# Patient Record
Sex: Female | Born: 1970 | Race: White | Hispanic: No | Marital: Married | Smoking: Never smoker
Health system: Southern US, Community
[De-identification: ages and names within clinical notes are randomized; demographics above are authoritative.]

## PROBLEM LIST (undated history)

## (undated) DIAGNOSIS — D259 Leiomyoma of uterus, unspecified: Secondary | ICD-10-CM

## (undated) DIAGNOSIS — N393 Stress incontinence (female) (male): Secondary | ICD-10-CM

## (undated) DIAGNOSIS — K219 Gastro-esophageal reflux disease without esophagitis: Secondary | ICD-10-CM

## (undated) DIAGNOSIS — N951 Menopausal and female climacteric states: Secondary | ICD-10-CM

## (undated) DIAGNOSIS — I10 Essential (primary) hypertension: Secondary | ICD-10-CM

## (undated) DIAGNOSIS — E782 Mixed hyperlipidemia: Secondary | ICD-10-CM

## (undated) DIAGNOSIS — A630 Anogenital (venereal) warts: Secondary | ICD-10-CM

## (undated) DIAGNOSIS — Z803 Family history of malignant neoplasm of breast: Secondary | ICD-10-CM

## (undated) DIAGNOSIS — E118 Type 2 diabetes mellitus with unspecified complications: Secondary | ICD-10-CM

## (undated) DIAGNOSIS — Z9289 Personal history of other medical treatment: Secondary | ICD-10-CM

## (undated) DIAGNOSIS — E119 Type 2 diabetes mellitus without complications: Secondary | ICD-10-CM

## (undated) DIAGNOSIS — E785 Hyperlipidemia, unspecified: Secondary | ICD-10-CM

## (undated) DIAGNOSIS — R7989 Other specified abnormal findings of blood chemistry: Secondary | ICD-10-CM

## (undated) HISTORY — DX: Family history of malignant neoplasm of breast: Z80.3

## (undated) HISTORY — DX: Anogenital (venereal) warts: A63.0

## (undated) HISTORY — DX: Essential (primary) hypertension: I10

## (undated) HISTORY — DX: Type 2 diabetes mellitus without complications: E11.9

## (undated) HISTORY — DX: Stress incontinence (female) (male): N39.3

## (undated) HISTORY — DX: Personal history of other medical treatment: Z92.89

## (undated) HISTORY — DX: Hyperlipidemia, unspecified: E78.5

## (undated) HISTORY — DX: Leiomyoma of uterus, unspecified: D25.9

## (undated) HISTORY — PX: EYE SURGERY: SHX253

---

## 1996-03-26 DIAGNOSIS — I10 Essential (primary) hypertension: Secondary | ICD-10-CM

## 1996-03-26 DIAGNOSIS — E119 Type 2 diabetes mellitus without complications: Secondary | ICD-10-CM

## 1996-03-26 HISTORY — DX: Type 2 diabetes mellitus without complications: E11.9

## 1996-03-26 HISTORY — DX: Essential (primary) hypertension: I10

## 1998-03-26 DIAGNOSIS — N393 Stress incontinence (female) (male): Secondary | ICD-10-CM

## 1998-03-26 HISTORY — DX: Stress incontinence (female) (male): N39.3

## 2001-12-11 ENCOUNTER — Other Ambulatory Visit: Admission: RE | Admit: 2001-12-11 | Discharge: 2001-12-11 | Payer: Self-pay | Admitting: Gynecology

## 2004-09-10 ENCOUNTER — Emergency Department: Payer: Self-pay | Admitting: Emergency Medicine

## 2007-09-17 ENCOUNTER — Ambulatory Visit: Payer: Self-pay | Admitting: Obstetrics & Gynecology

## 2010-06-01 ENCOUNTER — Ambulatory Visit: Payer: Self-pay | Admitting: Obstetrics & Gynecology

## 2010-06-08 HISTORY — PX: INTRAUTERINE DEVICE (IUD) INSERTION: SHX5877

## 2010-07-29 ENCOUNTER — Ambulatory Visit: Payer: Self-pay | Admitting: Internal Medicine

## 2011-02-05 ENCOUNTER — Ambulatory Visit: Payer: Self-pay | Admitting: Family Medicine

## 2011-02-24 ENCOUNTER — Ambulatory Visit: Payer: Self-pay | Admitting: Family Medicine

## 2011-03-27 ENCOUNTER — Ambulatory Visit: Payer: Self-pay | Admitting: Family Medicine

## 2011-10-11 ENCOUNTER — Emergency Department: Payer: Self-pay | Admitting: Emergency Medicine

## 2011-10-11 LAB — URINALYSIS, COMPLETE
Bacteria: NONE SEEN
Bilirubin,UR: NEGATIVE
Glucose,UR: NEGATIVE mg/dL (ref 0–75)
Ketone: NEGATIVE
Leukocyte Esterase: NEGATIVE
Ph: 6 (ref 4.5–8.0)
RBC,UR: <1 /HPF (ref 0–5)
Specific Gravity: 1.003 (ref 1.003–1.030)
Squamous Epithelial: <1
WBC UR: NONE SEEN /HPF (ref 0–5)

## 2011-10-11 LAB — BASIC METABOLIC PANEL
Anion Gap: 8 (ref 7–16)
BUN: 12 mg/dL (ref 7–18)
Calcium, Total: 9.5 mg/dL (ref 8.5–10.1)
EGFR (African American): >60
EGFR (Non-African Amer.): >60
Glucose: 194 mg/dL — ABNORMAL HIGH (ref 65–99)
Osmolality: 286 (ref 275–301)
Potassium: 4.8 mmol/L (ref 3.5–5.1)
Sodium: 141 mmol/L (ref 136–145)

## 2011-10-11 LAB — CK TOTAL AND CKMB (NOT AT ARMC): CK, Total: 56 U/L (ref 21–215)

## 2011-10-11 LAB — CBC
HCT: 36.2 % (ref 35.0–47.0)
MCHC: 32.6 g/dL (ref 32.0–36.0)
MCV: 90 fL (ref 80–100)
RBC: 4.05 10*6/uL (ref 3.80–5.20)
RDW: 12.9 % (ref 11.5–14.5)
WBC: 5.1 10*3/uL (ref 3.6–11.0)

## 2011-10-11 LAB — TROPONIN I: Troponin-I: <0.02 ng/mL

## 2012-04-28 IMAGING — CR DG LUMBAR SPINE COMPLETE 4+V
1 series · 5 of 5 positions shown · non-contrast
Comparison: none

REASON FOR EXAM: Fell 1 week ago; c/o low back pain/"tailbone'  pain
COMMENTS:   LMP: One week ago

PROCEDURE:     MDR - M[REDACTED] SPINE WITH OBLIQUES  - July 29, 2010  [DATE]
RESULT:     Comparison: None.

[Series 1: view not recorded · 0.17mm/px · 5 of 5 slices shown]
[im 1/5]
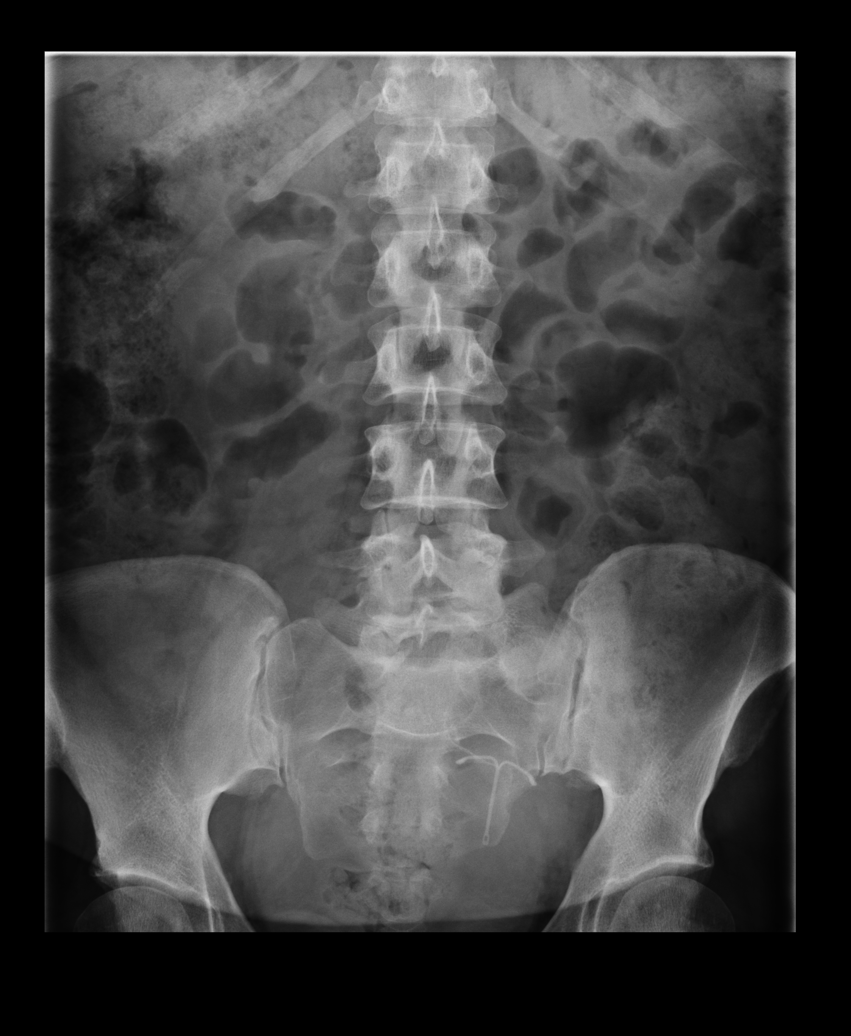
[im 2/5]
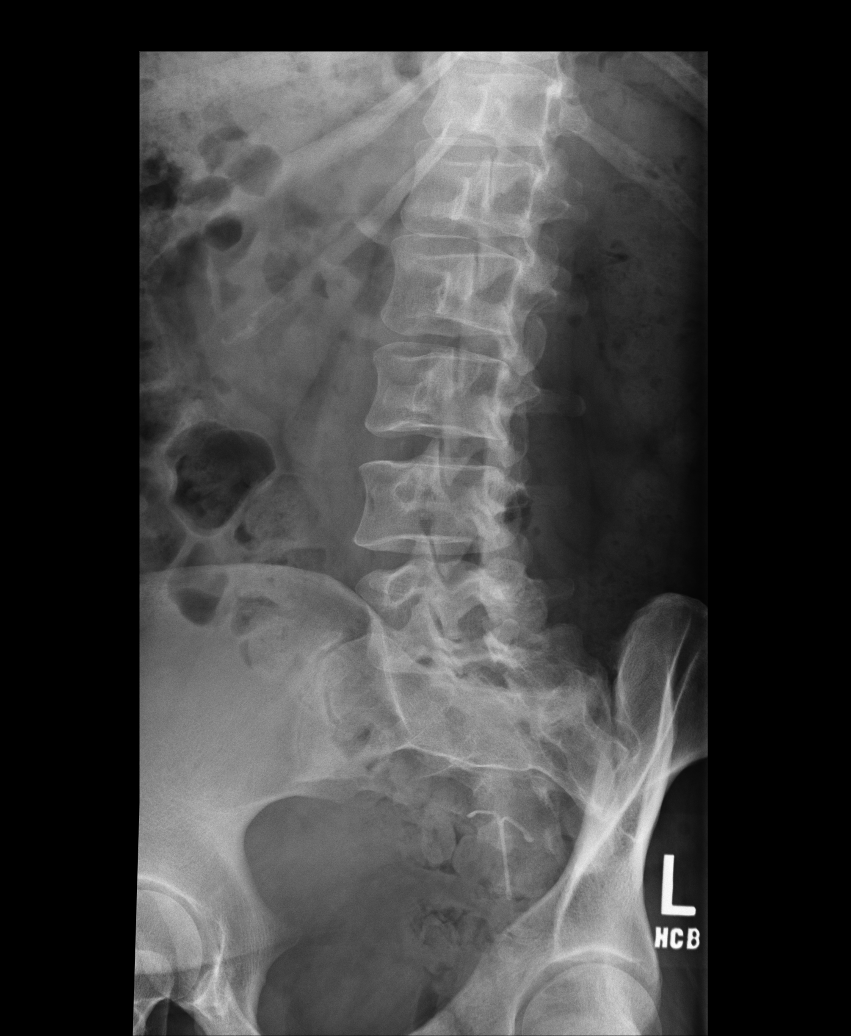
[im 3/5]
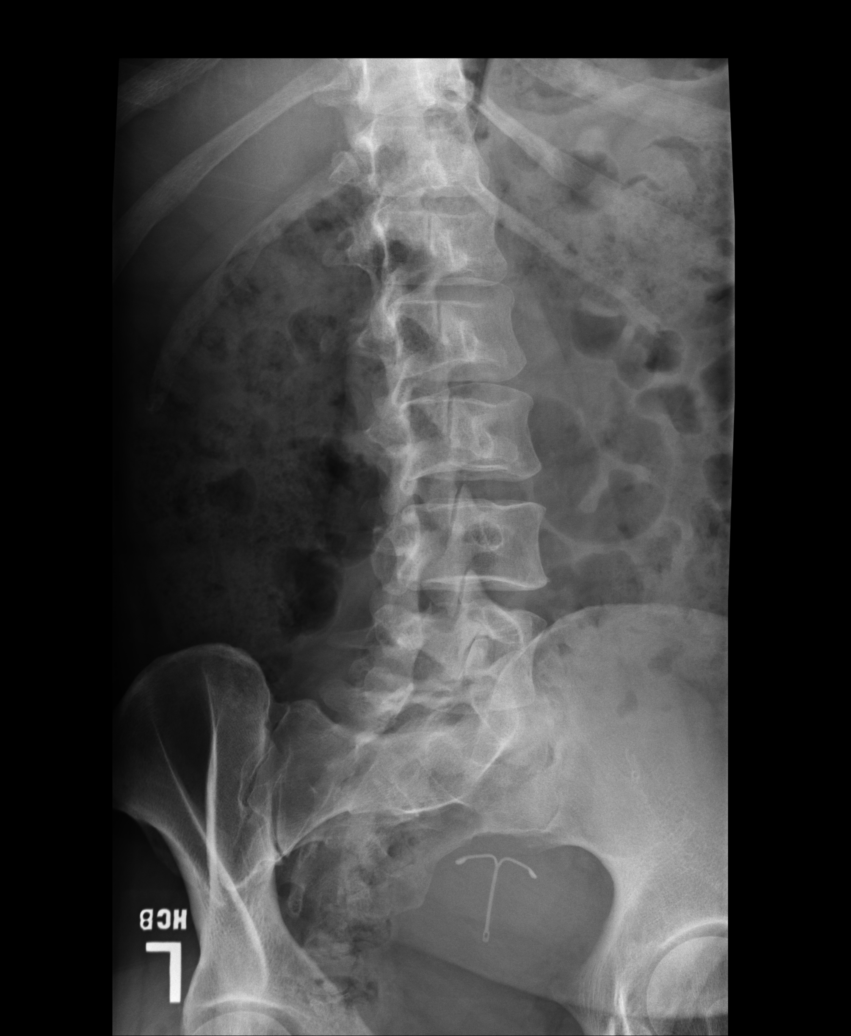
[im 4/5]
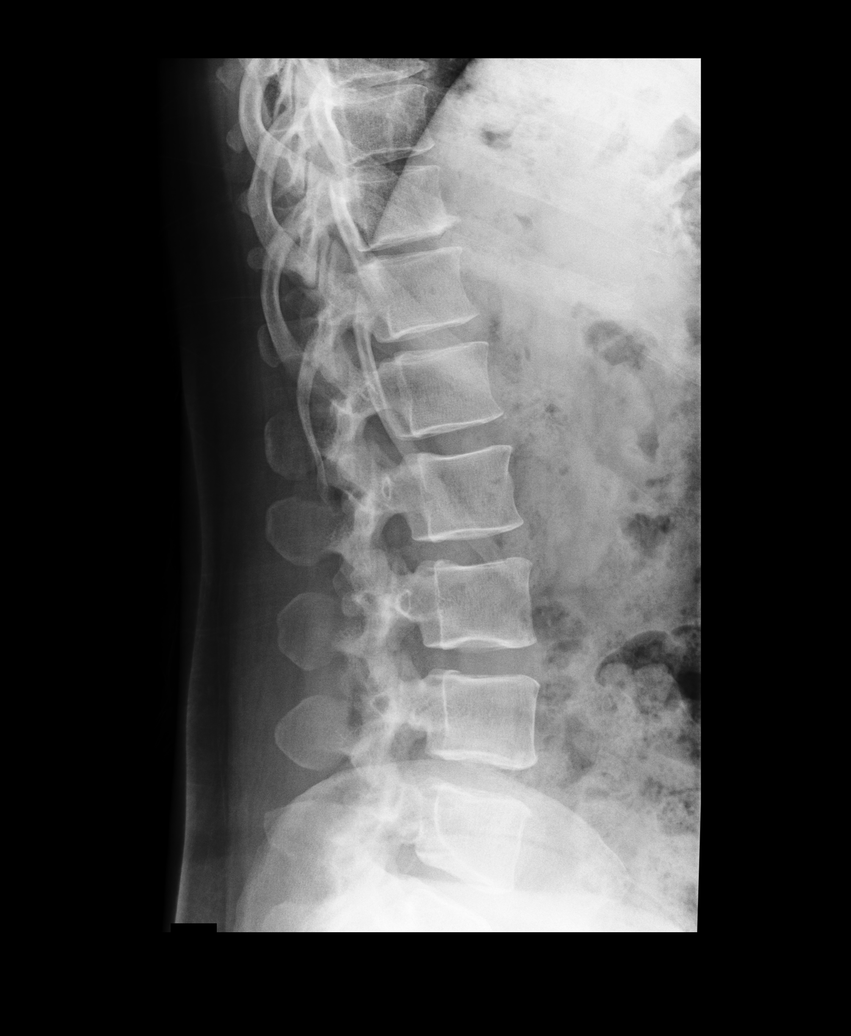
[im 5/5]
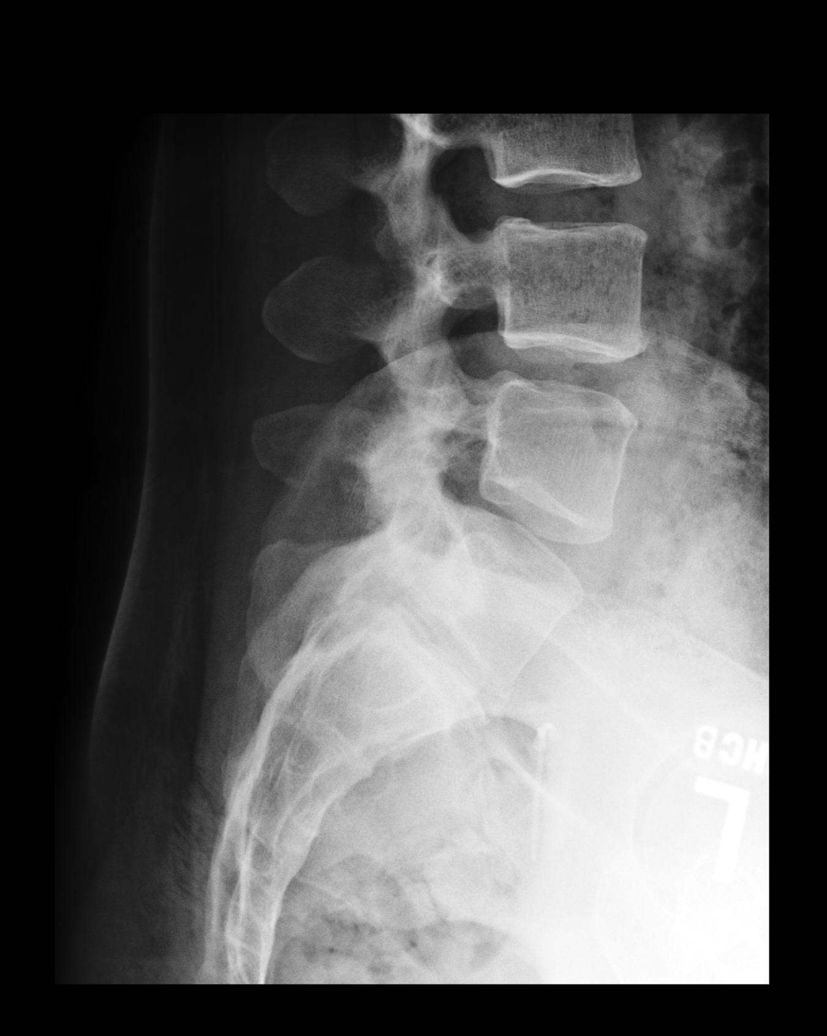

[5 of 5 positions shown; findings below may reference images not displayed]

FINDINGS: There are 5 lumbar type vertebral bodies, lumbarization of S1.  No pars
fracture seen on the oblique views. Vertebral body heights and
intervertebral disc heights are maintained. Mild osteophytosis is seen at
T11-T12, anteriorly. An IUD overlies the pelvis.
IMPRESSION: No acute findings.

## 2012-10-16 ENCOUNTER — Encounter: Payer: Self-pay | Admitting: Adult Health

## 2012-10-16 ENCOUNTER — Ambulatory Visit (INDEPENDENT_AMBULATORY_CARE_PROVIDER_SITE_OTHER): Payer: Federal, State, Local not specified - PPO | Admitting: Adult Health

## 2012-10-16 VITALS — BP 110/70 | HR 88 | Temp 98.1°F | Resp 12 | Ht 65.5 in | Wt 186.5 lb

## 2012-10-16 DIAGNOSIS — I10 Essential (primary) hypertension: Secondary | ICD-10-CM

## 2012-10-16 DIAGNOSIS — Z0000 Encounter for general adult medical examination without abnormal findings: Secondary | ICD-10-CM

## 2012-10-16 DIAGNOSIS — IMO0002 Reserved for concepts with insufficient information to code with codable children: Secondary | ICD-10-CM | POA: Insufficient documentation

## 2012-10-16 DIAGNOSIS — Z1239 Encounter for other screening for malignant neoplasm of breast: Secondary | ICD-10-CM | POA: Insufficient documentation

## 2012-10-16 DIAGNOSIS — E1165 Type 2 diabetes mellitus with hyperglycemia: Secondary | ICD-10-CM

## 2012-10-16 DIAGNOSIS — IMO0001 Reserved for inherently not codable concepts without codable children: Secondary | ICD-10-CM

## 2012-10-16 DIAGNOSIS — R21 Rash and other nonspecific skin eruption: Secondary | ICD-10-CM

## 2012-10-16 MED ORDER — TRIAMCINOLONE ACETONIDE 0.1 % EX CREA: TOPICAL_CREAM | Freq: Two times a day (BID) | CUTANEOUS | Status: DC

## 2012-10-16 MED ORDER — LISINOPRIL 20 MG PO TABS: 20.0000 mg | ORAL_TABLET | Freq: Every day | ORAL | Status: DC

## 2012-10-16 NOTE — Progress Notes (Signed)
Subjective:    Patient ID: Linda Roach, female    DOB: [birth year], 42 y.o.   MRN: [id number]  HPI  Pt is a pleasant 42 year old female who presents to clinic to establish care. She was previously followed by [facility] in [place]. I will request medical records. Patient has a history of diabetes mellitus type 2 uncontrolled. She denies complications from diabetes. She was diagnosed approximately 17 years ago. Patient is currently on metformin 1000 mg twice a day, Lantus 20 units in the morning and Humalog when necessary. She reports having a yearly eye exam and has this done with Dr. Candice Camp in [place]. Patient is followed by [facility] for her gynecological needs. Patient reports that, other than her diabetes, she feels she is in good health.   Past Medical History  Diagnosis Date  . Diabetes mellitus without complication 1998    Type 2  . Hypertension 1998    Currently on lisinopril - well controlled  . Stress incontinence 2000   Surgical Hx: None  Family History  Problem Relation Age of Onset  . Heart disease Father 10    CAD s/p quadruple bypass  . Diabetes Father   . Diabetes Maternal Grandmother   . Cancer Paternal Grandmother     breast cancer  . Heart disease Paternal Grandmother   . Diabetes Paternal Grandmother   . Cancer Paternal Grandfather     prostate cancer  . Arthritis Brother   . Hyperlipidemia Brother   . Hypertension Brother     History   Social History  . Marital Status: Married    Spouse Name: Roger Shelter    Number of Children: 1  . Years of Education: 12   Occupational History  . Mail Carrier     [employer]   Social History Main Topics  . Smoking status: Never Smoker   . Smokeless tobacco: Never Used  . Alcohol Use: Yes     Comment: Has a couple of drinks monthly  . Drug Use: No  . Sexually Active: Yes -- Female partner(s)    Birth Control/ Protection: IUD   Other Topics Concern  . Not on file   Social History  Narrative   [place] grew up [employer]. She is married to her husband of 22 years. They have a daughter. They have 3 dogs and 2 cats. They are currently living in [facility]. 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 She enjoys being outdoors. She tries to walk daily whenever possible. She loves the outdoors. Loves to [employer] and garden.     Health Maintenance:  Tdap - Needs - she will get this when she returns for fasting labs next week  PAP - Have been normal. Done at [facility]. She will set up appointment as this is due.  Mammography - 2 years ago normal. Patient will call [facility] to have mammogram done.   Colonoscopy - N/A  Labs - cbc w/diff, bmet, hepatic panel, tsh, lipids, HgbA1C - patient will return in 1 week for fasting labs  Depression Screen - No sadness, hopelessness or feelings of anhedonia.  Tobacco Use - Never  Dental Exams - Routinely twice a year  Vision Exam - Yearly with Dr. Candice Camp in [facility]  Foot Exam - Yearly. Normal  Exercise - Daily walks approximately 3-5 miles  Diet - Needs improvement. Needs healthy snack choices. She reports eating potato chips as a snack. Has healthy meal at dinner.  Review of Systems  Constitutional: Negative.   HENT: Negative.   Eyes:       Eye exam yearly.  Respiratory: Negative.   Cardiovascular: Negative.   Gastrointestinal: Positive for nausea.       Nausea after taking morning meds. Subsides after meal  Endocrine: Positive for polyphagia.  Genitourinary: Negative for dysuria, urgency, frequency, hematuria, vaginal discharge, difficulty urinating, vaginal pain, menstrual problem, pelvic pain and dyspareunia.  Musculoskeletal: Positive for back pain.       Low back issues.   Skin: Negative for rash.       Itching in scalp, under breast, groin  Allergic/Immunologic: Positive for food allergies.       Kiwi fruit - throat edema, mouth blisters  Neurological: Negative.     Hematological: Negative.   Psychiatric/Behavioral: Negative.    BP 110/70  Pulse 88  Temp(Src) 98.1 F (36.7 C) (Oral)  Resp 12  Ht 5' 5.5" (1.664 m)  Wt 186 lb 8 oz (84.596 kg)  BMI 30.55 kg/m2  SpO2 98%  LMP 09/12/2012    Objective:   Physical Exam  Constitutional: She is oriented to person, place, and time. She appears well-developed and well-nourished. No distress.  HENT:  Head: Normocephalic and atraumatic.  Right Ear: External ear normal.  Left Ear: External ear normal.  Nose: Nose normal.  Mouth/Throat: Oropharynx is clear and moist.  Eyes: Conjunctivae and EOM are normal. Pupils are equal, round, and reactive to light.  Neck: Normal range of motion. Neck supple. No tracheal deviation present. No thyromegaly present.  Cardiovascular: Normal rate, regular rhythm, normal heart sounds and intact distal pulses.  Exam reveals no gallop and no friction rub.   No murmur heard. Pulmonary/Chest: Effort normal and breath sounds normal. No respiratory distress. She has no wheezes. She has no rales. She exhibits no tenderness.  Abdominal: Soft. Bowel sounds are normal. She exhibits no distension. There is no tenderness. There is no rebound and no guarding.  Musculoskeletal: Normal range of motion. She exhibits no edema and no tenderness.  Lymphadenopathy:    She has no cervical adenopathy.  Neurological: She is alert and oriented to person, place, and time. She has normal reflexes. She displays normal reflexes. No cranial nerve deficit. She exhibits normal muscle tone. Coordination normal.  Skin: Skin is warm and dry.     Psychiatric: She has a normal mood and affect. Her behavior is normal. Judgment and thought content normal.      Assessment & Plan:

## 2012-10-16 NOTE — Assessment & Plan Note (Signed)
Rash behind bilateral ears at hairline. Apply triamcinolone cream twice a day to affected area.

## 2012-10-16 NOTE — Assessment & Plan Note (Addendum)
Fasting fingerstick blood sugar running between 170 and 230. 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 We discussed the importance of proper nutrition with her diabetes for adequate control and to prevent further complications down the road. Discussed healthier snack options such as nuts, cheese, whole wheat crackers with peanut butter. Patient exercises almost daily. Check hemoglobin A1c. May need adjustment in medication depending on results. Note, greater than 60 minutes were spent in face-to-face communication with patient in the assessment, evaluation, planning and implementation of plan of care pertaining to the problems listed during this visit.

## 2012-10-16 NOTE — Assessment & Plan Note (Signed)
Well controlled on current dose of lisinopril 20 mg daily. Continue to monitor.

## 2012-10-16 NOTE — Assessment & Plan Note (Signed)
Last mammography was 2 years ago. Normal. Discussed having yearly mammograms. Patient in agreement. She will have mammogram done at [facility] imaging.

## 2012-10-16 NOTE — Patient Instructions (Addendum)
   Thank you for choosing [facility] for your health care needs.  Please return for fasting labs. You may drink water or black coffee.  The results will be available through MyChart for your convenience. Please remember to activate this. The activation code is located at the end of this form.  I have sent in a prescription to your pharmacy for triamcinolone. Apply behind your ears twice daily to see if this helps with the itching.  We will give you the tetanus/pertusis vaccine (Tdap) when you return for fasting labs.  I have ordered your yearly Mammography.

## 2012-10-16 NOTE — Assessment & Plan Note (Addendum)
Normal physical exam excluding breast and pelvic as she will be getting GYN exam through [facility]. Check labs: CBC with differential, basic metabolic panel, hepatic panel, lipids, TSH, hemoglobin A1c. Obtain medical records from previous PCP at [facility] family [facility] in [place].

## 2012-10-24 ENCOUNTER — Other Ambulatory Visit (INDEPENDENT_AMBULATORY_CARE_PROVIDER_SITE_OTHER): Payer: Federal, State, Local not specified - PPO

## 2012-10-24 DIAGNOSIS — E1165 Type 2 diabetes mellitus with hyperglycemia: Secondary | ICD-10-CM

## 2012-10-24 DIAGNOSIS — IMO0002 Reserved for concepts with insufficient information to code with codable children: Secondary | ICD-10-CM

## 2012-10-24 DIAGNOSIS — IMO0001 Reserved for inherently not codable concepts without codable children: Secondary | ICD-10-CM

## 2012-10-24 DIAGNOSIS — Z0000 Encounter for general adult medical examination without abnormal findings: Secondary | ICD-10-CM

## 2012-10-24 LAB — CBC WITH DIFFERENTIAL/PLATELET
Basophils Absolute: 0 10*3/uL (ref 0.0–0.1)
Basophils Relative: 0.8 % (ref 0.0–3.0)
Eosinophils Absolute: 0.1 10*3/uL (ref 0.0–0.7)
HCT: 37.2 % (ref 36.0–46.0)
Hemoglobin: 12.6 g/dL (ref 12.0–15.0)
Lymphocytes Relative: 27.7 % (ref 12.0–46.0)
Lymphs Abs: 1.6 10*3/uL (ref 0.7–4.0)
MCHC: 34 g/dL (ref 30.0–36.0)
MCV: 88.6 fl (ref 78.0–100.0)
Neutro Abs: 3.5 10*3/uL (ref 1.4–7.7)
RBC: 4.2 Mil/uL (ref 3.87–5.11)
RDW: 12.7 % (ref 11.5–14.6)

## 2012-10-24 LAB — HEPATIC FUNCTION PANEL
AST: 15 U/L (ref 0–37)
Alkaline Phosphatase: 52 U/L (ref 39–117)
Bilirubin, Direct: 0.1 mg/dL (ref 0.0–0.3)

## 2012-10-24 LAB — BASIC METABOLIC PANEL
CO2: 26 mEq/L (ref 19–32)
Calcium: 9.6 mg/dL (ref 8.4–10.5)
GFR: 138.04 mL/min (ref >60.00)
Glucose, Bld: 87 mg/dL (ref 70–99)
Potassium: 4.4 mEq/L (ref 3.5–5.1)
Sodium: 137 mEq/L (ref 135–145)

## 2012-10-24 LAB — LIPID PANEL
HDL: 35.5 mg/dL — ABNORMAL LOW (ref >39.00)
Total CHOL/HDL Ratio: 4

## 2012-10-27 ENCOUNTER — Encounter: Payer: Self-pay | Admitting: *Deleted

## 2012-11-04 ENCOUNTER — Encounter: Payer: Self-pay | Admitting: Adult Health

## 2012-11-04 ENCOUNTER — Other Ambulatory Visit: Payer: Self-pay | Admitting: *Deleted

## 2012-11-04 ENCOUNTER — Ambulatory Visit (INDEPENDENT_AMBULATORY_CARE_PROVIDER_SITE_OTHER): Payer: Federal, State, Local not specified - PPO | Admitting: Adult Health

## 2012-11-04 VITALS — BP 108/68 | HR 91 | Resp 12 | Wt 185.0 lb

## 2012-11-04 DIAGNOSIS — IMO0001 Reserved for inherently not codable concepts without codable children: Secondary | ICD-10-CM

## 2012-11-04 DIAGNOSIS — E1165 Type 2 diabetes mellitus with hyperglycemia: Secondary | ICD-10-CM

## 2012-11-04 DIAGNOSIS — Z23 Encounter for immunization: Secondary | ICD-10-CM

## 2012-11-04 DIAGNOSIS — IMO0002 Reserved for concepts with insufficient information to code with codable children: Secondary | ICD-10-CM

## 2012-11-04 MED ORDER — TETANUS-DIPHTH-ACELL PERTUSSIS 5-2.5-18.5 LF-MCG/0.5 IM SUSP: 0.5000 mL | Freq: Once | INTRAMUSCULAR | Status: DC

## 2012-11-04 MED ORDER — INSULIN GLARGINE 100 UNIT/ML ~~LOC~~ SOLN: 20.0000 [IU] | Freq: Every day | SUBCUTANEOUS | Status: DC

## 2012-11-04 MED ORDER — LINAGLIPTIN-METFORMIN HCL 2.5-1000 MG PO TABS: 1.0000 | ORAL_TABLET | Freq: Two times a day (BID) | ORAL | Status: DC

## 2012-11-04 MED ORDER — GLUCOSE BLOOD VI STRP: ORAL_STRIP | Status: DC

## 2012-11-04 NOTE — Progress Notes (Signed)
  Subjective:    Patient ID: Linda Roach, female    DOB: 1970-10-29, 42 y.o.   MRN: [id number]  HPI  Patient presents for follow up on her diabetes. Her A1c is 10.8 and she reports that it has not been below 7 in years. 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 She spends hours without eating anything. She is aware that she needs to make changes if she is going to get her diabetes under control.  Current Outpatient Prescriptions on File Prior to Visit  Medication Sig Dispense Refill  . acetaminophen (TYLENOL) 500 MG tablet Take 500 mg by mouth daily as needed for pain.      Marland Kitchen insulin lispro (HUMALOG) 100 UNIT/ML injection Inject into the skin as needed for high blood sugar.      . levonorgestrel (MIRENA) 20 MCG/24HR IUD 1 each by Intrauterine route once.      Marland Kitchen lisinopril (PRINIVIL,ZESTRIL) 20 MG tablet Take 1 tablet (20 mg total) by mouth daily.  30 tablet  5  . NON FORMULARY Take 2 tablets by mouth daily. Green foods veggies for life supplement      . NON FORMULARY Take 1 tablet by mouth daily. Hydroxycut      . omeprazole (PRILOSEC) 20 MG capsule Take 20 mg by mouth daily.      Marland Kitchen triamcinolone cream (KENALOG) 0.1 % Apply topically 2 (two) times daily.  30 g  0   No current facility-administered medications on file prior to visit.      Review of Systems  Constitutional: Negative.   Eyes: Negative.   Respiratory: Negative.   Cardiovascular: Negative.   Gastrointestinal: Negative.   Genitourinary: Negative.   Neurological: Negative.     BP 108/68  Pulse 91  Resp 12  Wt 185 lb (83.915 kg)  BMI 30.31 kg/m2  SpO2 99%  LMP 09/12/2012    Objective:   Physical Exam  Constitutional: She is oriented to person, place, and time.  Slightly overweight, pleasant female in NAD.  HENT:  Head: Normocephalic and atraumatic.  Cardiovascular: Normal rate and regular rhythm.   Pulmonary/Chest: Effort normal. No respiratory distress.  Abdominal: Soft.  Bowel sounds are normal.  Musculoskeletal: Normal range of motion. She exhibits no edema and no tenderness.  Neurological: She is alert and oriented to person, place, and time.  Skin: Skin is warm and dry. No rash noted. No erythema.  Psychiatric: She has a normal mood and affect. Her behavior is normal. Judgment and thought content normal.          Assessment & Plan:

## 2012-11-04 NOTE — Assessment & Plan Note (Addendum)
HgbA1c elevated 10.8 showing poorly controlled diabetes. Discussed importance of getting her diabetes under control to prevent end organ damage. We will add linagliptin-metformin 2.5mg -1000mg . Patient was taking lantus 20 units in the morning. She will now take the lantus at bedtime with a snack. Check blood glucose 3 times daily x 2 weeks and record. She needs to eat every 2-3 hours (healthy options). 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 She has regular insulin to cover using sliding scale. Follow up in 2 week. Next HgbA1c in 3 months. Diabetic foot exam done and was normal.

## 2012-11-19 ENCOUNTER — Encounter: Payer: Self-pay | Admitting: Adult Health

## 2012-11-19 ENCOUNTER — Ambulatory Visit (INDEPENDENT_AMBULATORY_CARE_PROVIDER_SITE_OTHER): Payer: Federal, State, Local not specified - PPO | Admitting: Adult Health

## 2012-11-19 VITALS — BP 108/66 | HR 86 | Resp 12 | Wt 183.5 lb

## 2012-11-19 DIAGNOSIS — IMO0001 Reserved for inherently not codable concepts without codable children: Secondary | ICD-10-CM

## 2012-11-19 DIAGNOSIS — IMO0002 Reserved for concepts with insufficient information to code with codable children: Secondary | ICD-10-CM

## 2012-11-19 DIAGNOSIS — E1165 Type 2 diabetes mellitus with hyperglycemia: Secondary | ICD-10-CM

## 2012-11-19 NOTE — Assessment & Plan Note (Signed)
Continue to monitor BG at least 3 times daily and record. Take linagliptin-metformin 2.07-998 mg bid. Lantus 20 units at HS. Use sliding scale for humalog coverage as discussed. Send MyChart BG log in one week. We will continue to adjust. If BG still difficult to manage then will send her to endocrinologist.

## 2012-11-19 NOTE — Progress Notes (Signed)
Subjective:    Patient ID: Linda Roach, female    DOB: 14-Jun-1970, 42 y.o.   MRN: [id number]  HPI  Patient is a pleasant 42 year old female with history of diabetes who presents for followup. She brings with her a record of her blood glucose readings. She has been checking these at least 3 times a day. Some of her morning readings are registering high as well as some of her bedtime readings. She was recently started on linagliptin-metformin 2.5 mg-1000 mg bid. She is also taking Lantus 20 units at bedtime. She reports that she will cover herself with regular insulin based on what she is getting ready to eat. She reports that this is what she was told when she went for diabetic education when first diagnosed. At one point she was also told that she would probably need be on insulin because the oral meds would not work for her. Pt has lost a considerable amount of weight since and reports that she does see a difference with oral hypoglycemic agent. 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 She reports going for hours without anything to eat or drink. Then she feasts when she gets home. I have been encouraging her to eat every 2-3 hours. Take healthy snacks with her in the car which she has been doing.  She has two recorded lows (below 70) in the middle of the night. She reports that she takes the Lantus but then will sometime also cover with humalog but she will not have a bedtime snack.   Current Outpatient Prescriptions on File Prior to Visit  Medication Sig Dispense Refill  . acetaminophen (TYLENOL) 500 MG tablet Take 500 mg by mouth daily as needed for pain.      Marland Kitchen glucose blood test strip Use as instructed to check blood sugar three times daily. Pt has Contour Next EZ glucometer. Dx 250.00  100 each  11  . insulin glargine (LANTUS) 100 UNIT/ML injection Inject 0.2 mLs (20 Units total) into the skin at bedtime.  10 mL    . insulin lispro (HUMALOG) 100  UNIT/ML injection Inject into the skin as needed for high blood sugar.      . levonorgestrel (MIRENA) 20 MCG/24HR IUD 1 each by Intrauterine route once.      . Linagliptin-Metformin HCl 2.07-998 MG TABS Take 1 tablet by mouth 2 (two) times daily.  60 tablet  0  . lisinopril (PRINIVIL,ZESTRIL) 20 MG tablet Take 1 tablet (20 mg total) by mouth daily.  30 tablet  5  . NON FORMULARY Take 2 tablets by mouth daily. Green foods veggies for life supplement      . NON FORMULARY Take 1 tablet by mouth daily. Hydroxycut      . omeprazole (PRILOSEC) 20 MG capsule Take 20 mg by mouth daily.      Marland Kitchen triamcinolone cream (KENALOG) 0.1 % Apply topically 2 (two) times daily.  30 g  0   Current Facility-Administered Medications on File Prior to Visit  Medication Dose Route Frequency Provider Last Rate Last Dose  . TDaP (BOOSTRIX) injection 0.5 mL  0.5 mL Intramuscular Once Dyamond Tolosa, NP        Review of Systems  Constitutional: Negative.   Respiratory: Negative.   Cardiovascular: Negative.   Gastrointestinal: Negative.   Neurological: Negative.   Psychiatric/Behavioral: Negative.      BP 108/66  Pulse 86  Resp 12  Wt 183 lb 8 oz (83.235  kg)  BMI 30.06 kg/m2  SpO2 98%    Objective:   Physical Exam  Constitutional: She is oriented to person, place, and time. She appears well-developed and well-nourished.  Cardiovascular: Normal rate and regular rhythm.   Pulmonary/Chest: Effort normal. No respiratory distress.  Musculoskeletal: Normal range of motion.  Neurological: She is alert and oriented to person, place, and time.  Skin: Skin is warm and dry.  Psychiatric: She has a normal mood and affect. Her behavior is normal. Judgment and thought content normal.      Assessment & Plan:

## 2012-11-19 NOTE — Patient Instructions (Addendum)
  Continue to check blood glucose at least 3 times daily.  Use insulin sliding scale I provided you to cover your highs.  Continue oral medication.  Eat every 2-3 hours - protein, complex carbohydrate  Send me a weekly record of your blood glucose through MyChart and we will continue to adjust.

## 2012-12-01 ENCOUNTER — Encounter: Payer: Self-pay | Admitting: Internal Medicine

## 2013-01-29 ENCOUNTER — Other Ambulatory Visit: Payer: Self-pay

## 2013-02-01 ENCOUNTER — Encounter: Payer: Self-pay | Admitting: Adult Health

## 2013-02-02 ENCOUNTER — Other Ambulatory Visit: Payer: Self-pay | Admitting: Adult Health

## 2013-02-02 MED ORDER — LISINOPRIL 20 MG PO TABS: 20.0000 mg | ORAL_TABLET | Freq: Every day | ORAL | Status: DC

## 2013-02-02 MED ORDER — INSULIN LISPRO 100 UNIT/ML ~~LOC~~ SOLN: SUBCUTANEOUS | Status: DC

## 2013-02-13 ENCOUNTER — Ambulatory Visit: Payer: Self-pay | Admitting: Unknown Physician Specialty

## 2013-02-23 HISTORY — PX: TONSILLECTOMY: SUR1361

## 2013-03-04 ENCOUNTER — Ambulatory Visit: Payer: Self-pay | Admitting: Obstetrics and Gynecology

## 2013-03-04 LAB — BASIC METABOLIC PANEL
Anion Gap: 5 — ABNORMAL LOW (ref 7–16)
BUN: 6 mg/dL — ABNORMAL LOW (ref 7–18)
Calcium, Total: 9.2 mg/dL (ref 8.5–10.1)
Chloride: 102 mmol/L (ref 98–107)
Co2: 28 mmol/L (ref 21–32)
EGFR (Non-African Amer.): >60
Glucose: 369 mg/dL — ABNORMAL HIGH (ref 65–99)
Sodium: 135 mmol/L — ABNORMAL LOW (ref 136–145)

## 2013-03-04 LAB — CBC
HGB: 12.4 g/dL (ref 12.0–16.0)
MCH: 29.8 pg (ref 26.0–34.0)
MCHC: 33.7 g/dL (ref 32.0–36.0)
RBC: 4.16 10*6/uL (ref 3.80–5.20)
WBC: 5.6 10*3/uL (ref 3.6–11.0)

## 2013-03-10 ENCOUNTER — Ambulatory Visit: Payer: Self-pay | Admitting: Obstetrics and Gynecology

## 2013-03-10 HISTORY — PX: INCONTINENCE SURGERY: SHX676

## 2013-06-10 ENCOUNTER — Telehealth: Payer: Self-pay | Admitting: Adult Health

## 2013-06-10 MED ORDER — INSULIN GLARGINE 100 UNIT/ML ~~LOC~~ SOLN: 20.0000 [IU] | Freq: Every day | SUBCUTANEOUS | Status: DC

## 2013-06-10 NOTE — Telephone Encounter (Signed)
Lantus sent to pharmacy. Left message for pt to call, med list shows Tradjenta rather than Metformin. Wanted to clarify she has been taking Metformin instead. Also advised need for appt since last appt 11/19/12.

## 2013-06-10 NOTE — Telephone Encounter (Signed)
[facility]  Senior Executive Assistant  Direct Dial: [phone]   Fax: [phone]  Website: [email or web address]   From: [email or web address] [[email or web address]] On [employer] Form Sent: Tuesday, June 09, 2013 7:53 AM To: Lorra Hals Subject: New submission from Contact us  Patient Name     Linda Roach   Email Address     [email or web address]   Date of Birth     05-Oct-1970   Subject     my chart/ meds   Message     Hi. I am a patient of Raquel Rey's and I have been unable to log on to Mychart. I am out of my Lantus insulin and will soon run out of metformin 50 mg. I take 20 units a day of lantus and 2 tablets of metformin twice daily. If these could be faxed in to [facility] in [place] i would greatly appreciate it. These are scripts froma a previous Dr. but i believe these are on my record there. Thank you, Drue Dun

## 2013-06-30 ENCOUNTER — Ambulatory Visit: Payer: Federal, State, Local not specified - PPO | Admitting: Adult Health

## 2013-07-03 ENCOUNTER — Encounter: Payer: Self-pay | Admitting: Adult Health

## 2013-07-03 ENCOUNTER — Ambulatory Visit (INDEPENDENT_AMBULATORY_CARE_PROVIDER_SITE_OTHER): Payer: Federal, State, Local not specified - PPO | Admitting: Adult Health

## 2013-07-03 VITALS — BP 138/70 | HR 92 | Temp 98.1°F | Resp 14 | Wt 181.0 lb

## 2013-07-03 DIAGNOSIS — IMO0002 Reserved for concepts with insufficient information to code with codable children: Secondary | ICD-10-CM

## 2013-07-03 DIAGNOSIS — IMO0001 Reserved for inherently not codable concepts without codable children: Secondary | ICD-10-CM

## 2013-07-03 DIAGNOSIS — E1165 Type 2 diabetes mellitus with hyperglycemia: Secondary | ICD-10-CM

## 2013-07-03 LAB — HEMOGLOBIN A1C
HEMOGLOBIN A1C: 11.1 % — AB (ref <5.7)
MEAN PLASMA GLUCOSE: 272 mg/dL — AB (ref <117)

## 2013-07-03 MED ORDER — INSULIN DETEMIR 100 UNIT/ML ~~LOC~~ SOLN
20.0000 [IU] | Freq: Every day | SUBCUTANEOUS | Status: DC
Start: 2013-07-03 — End: 2015-08-05

## 2013-07-03 MED ORDER — METFORMIN HCL 1000 MG PO TABS: 1000.0000 mg | ORAL_TABLET | Freq: Two times a day (BID) | ORAL | Status: DC

## 2013-07-03 MED ORDER — INSULIN LISPRO 100 UNIT/ML ~~LOC~~ SOLN: SUBCUTANEOUS | Status: AC

## 2013-07-03 NOTE — Progress Notes (Signed)
Pre visit review using our clinic review tool, if applicable. No additional management support is needed unless otherwise documented below in the visit note. 

## 2013-07-03 NOTE — Progress Notes (Signed)
Subjective:    Patient ID: Linda Roach, female    DOB: [birth year], 43 y.o.   MRN: [id number]  HPI  Patient is a pleasant 43 year old female who presents to clinic for followup diabetes, uncontrolled. She reports that she has not been checking her blood sugars on a regular basis. Diet has been poor although she is trying to improve this. She started to exercise 6 days a week in February and has been doing so ever since. She understands that her diabetes is probably not well controlled and she would like to try to get better control of her blood sugars.  Current Outpatient Prescriptions on File Prior to Visit  Medication Sig Dispense Refill  . acetaminophen (TYLENOL) 500 MG tablet Take 500 mg by mouth daily as needed for pain.      Marland Kitchen glucose blood test strip Use as instructed to check blood sugar three times daily. Pt has Contour Next EZ glucometer. Dx 250.00  100 each  11  . levonorgestrel (MIRENA) 20 MCG/24HR IUD 1 each by Intrauterine route once.      Marland Kitchen lisinopril (PRINIVIL,ZESTRIL) 20 MG tablet Take 1 tablet (20 mg total) by mouth daily.  30 tablet  5  . NON FORMULARY Take 2 tablets by mouth daily. Green foods veggies for life supplement      . NON FORMULARY Take 1 tablet by mouth daily. Hydroxycut      . omeprazole (PRILOSEC) 20 MG capsule Take 20 mg by mouth daily.      Marland Kitchen triamcinolone cream (KENALOG) 0.1 % Apply topically 2 (two) times daily.  30 g  0  . [DISCONTINUED] insulin glargine (LANTUS) 100 UNIT/ML injection Inject 0.2 mLs (20 Units total) into the skin at bedtime.  10 mL  0   No current facility-administered medications on file prior to visit.    Review of Systems  Constitutional: Negative.   HENT: Negative.   Eyes: Negative.   Respiratory: Negative.   Cardiovascular: Negative.   Gastrointestinal: Negative.   Endocrine: Negative.   Genitourinary: Negative.   Musculoskeletal: Negative.   Skin: Negative.   Allergic/Immunologic: Negative.   Neurological: Negative.     Hematological: Negative.   Psychiatric/Behavioral: Negative.        Objective:   Physical Exam  Constitutional: She is oriented to person, place, and time. No distress.  HENT:  Head: Normocephalic and atraumatic.  Eyes: Conjunctivae and EOM are normal.  Neck: Normal range of motion. Neck supple.  Cardiovascular: Normal rate, regular rhythm, normal heart sounds and intact distal pulses.  Exam reveals no gallop and no friction rub.   No murmur heard. Pulmonary/Chest: Effort normal and breath sounds normal. No respiratory distress. She has no wheezes. She has no rales.  Musculoskeletal: Normal range of motion.  Neurological: She is alert and oriented to person, place, and time. She has normal reflexes. Coordination normal.  Skin: Skin is warm and dry.  Psychiatric: She has a normal mood and affect. Her behavior is normal. Judgment and thought content normal.       Assessment & Plan:   1. Diabetes mellitus type 2, uncontrolled Has not been checking her blood sugars regularly. She is going to be more consistent. Follows sliding scale. Discussed checking AM blood glucose. If > 150 she is to increase her levemir by 3 units. She can adjust dose every 3 days until am blood glucose < 150. Discussed importance of diet. Congratulated her on her exercising which will help her diabetes. Check labs. Will adjust meds  as needed. Refills sent to pharmacy. RTC in 3 months for f/u - Hemoglobin M0L - Basic metabolic panel - Urine Microalbumin w/creat. ratio

## 2013-07-04 LAB — MICROALBUMIN / CREATININE URINE RATIO
Creatinine, Urine: 19.6 mg/dL
MICROALB UR: 0.5 mg/dL (ref 0.00–1.89)
Microalb Creat Ratio: 25.5 mg/g (ref 0.0–30.0)

## 2013-07-04 LAB — BASIC METABOLIC PANEL
BUN: 7 mg/dL (ref 6–23)
CHLORIDE: 101 meq/L (ref 96–112)
CO2: 24 mEq/L (ref 19–32)
Calcium: 9.5 mg/dL (ref 8.4–10.5)
Creat: 0.57 mg/dL (ref 0.50–1.10)
Glucose, Bld: 122 mg/dL — ABNORMAL HIGH (ref 70–99)
Potassium: 4.1 mEq/L (ref 3.5–5.3)
Sodium: 137 mEq/L (ref 135–145)

## 2013-07-06 ENCOUNTER — Encounter: Payer: Self-pay | Admitting: Adult Health

## 2013-07-11 IMAGING — US ABDOMEN ULTRASOUND LIMITED
1 series · 14 of 25 positions shown · non-contrast
Comparison: none

REASON FOR EXAM: RUQ pain and nausea
COMMENTS:   Body Site: GB and Fossa, CBD, Head of Pancreas

PROCEDURE:     US  - US ABDOMEN LIMITED SURVEY  - October 11, 2011  [DATE]
RESULT:     Comparison: None.
TECHNIQUE: Multiple grayscale and color Doppler images were obtained of the
right upper quadrant.

[Series 1: abdomen ultrasound limited · 0.28mm/px · 14 of 47 slices shown]
[im 1/47]
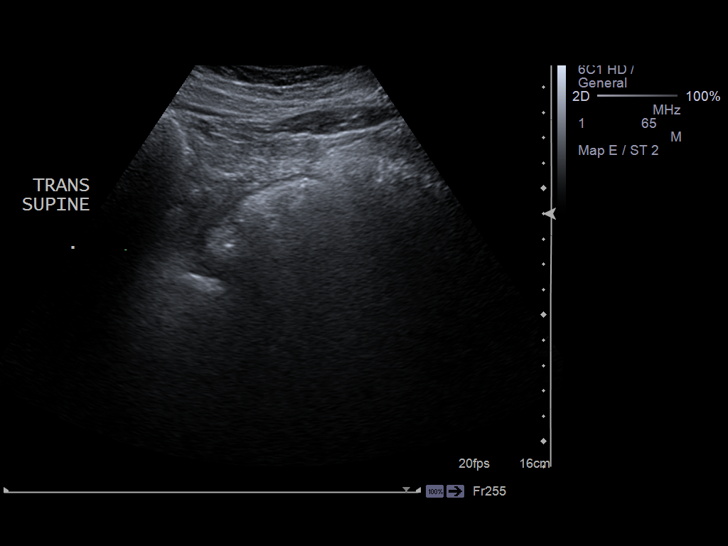
[im 4/47]
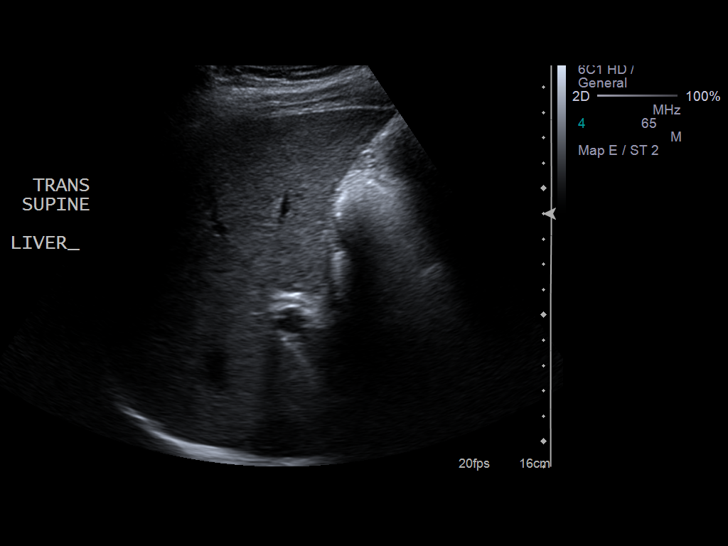
[im 8/47]
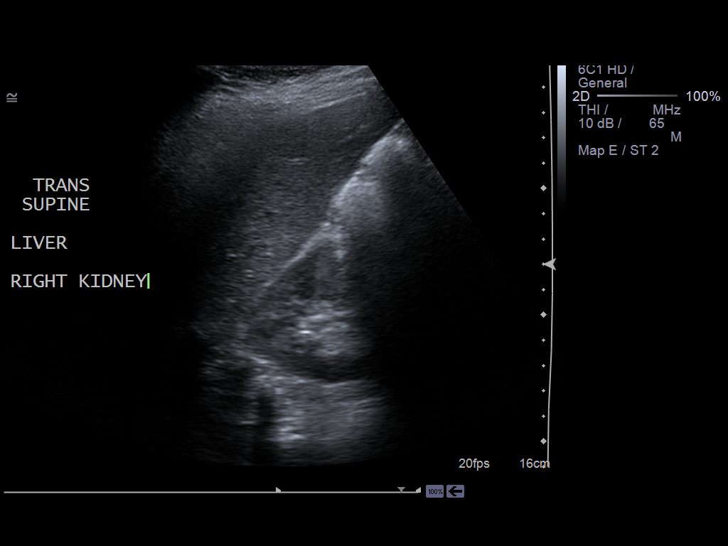
[im 12/47]
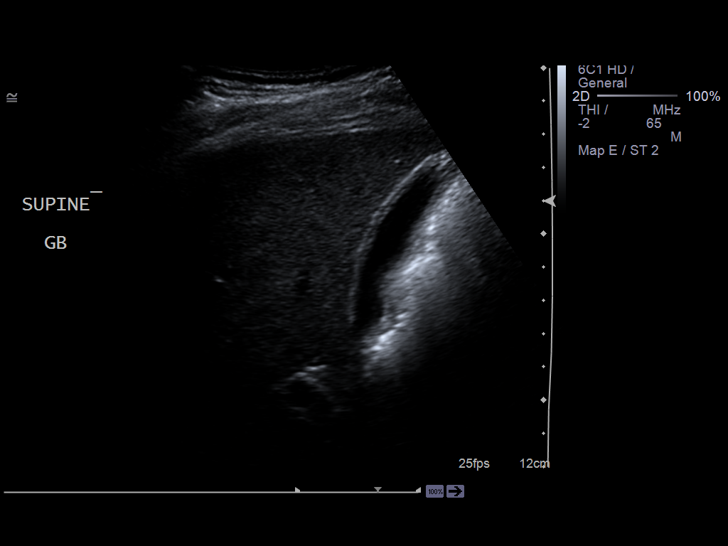
[im 16/47]
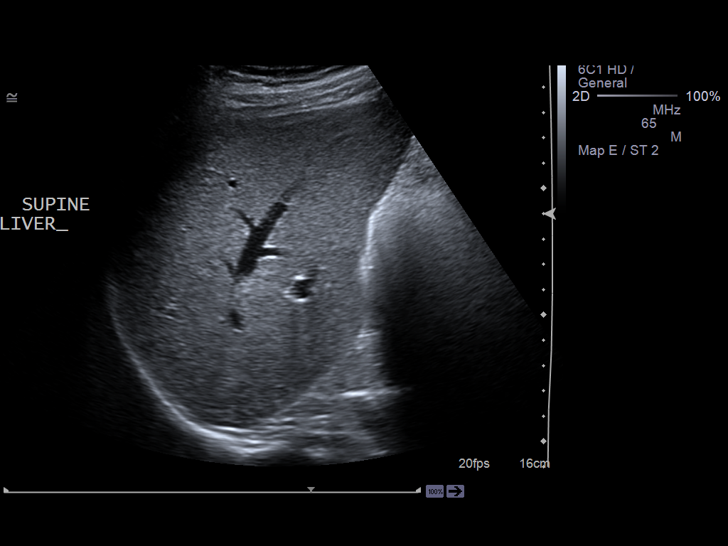
[im 18/47]
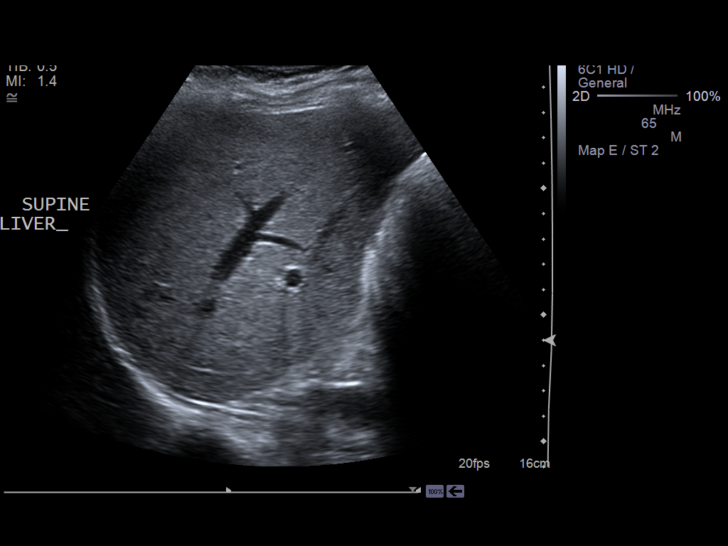
[im 22/47]
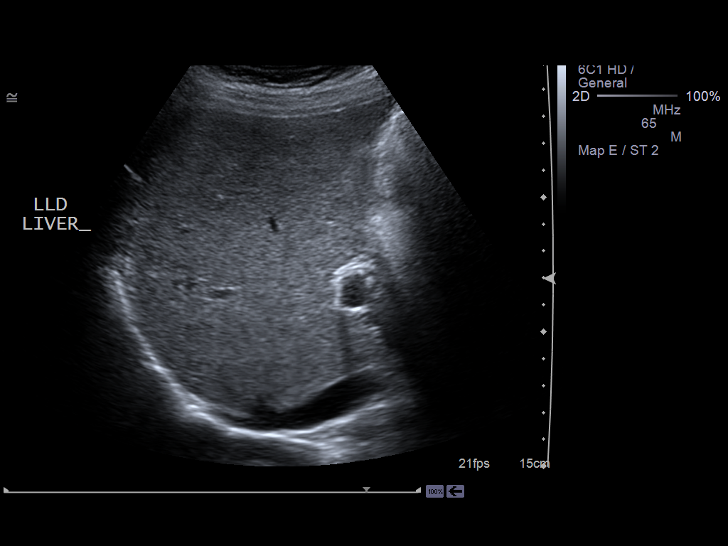
[im 25/47]
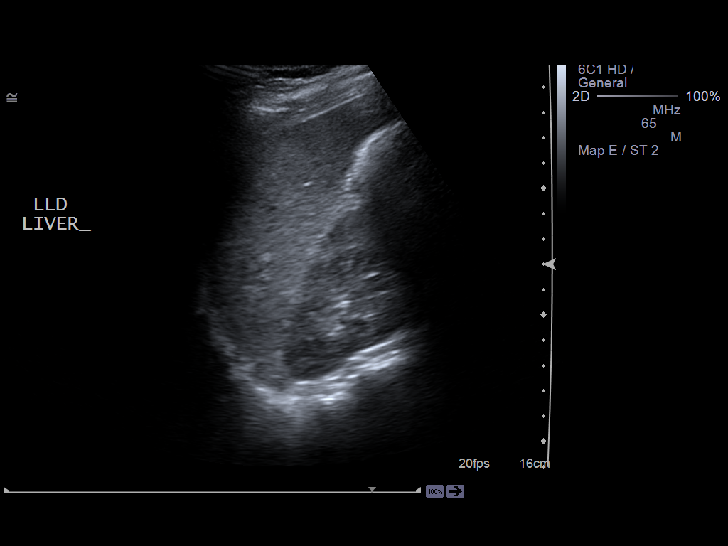
[im 29/47]
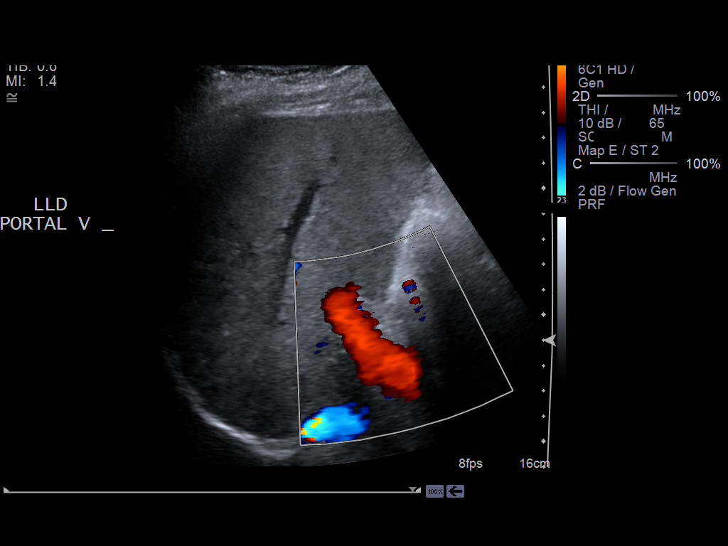
[im 31/47]
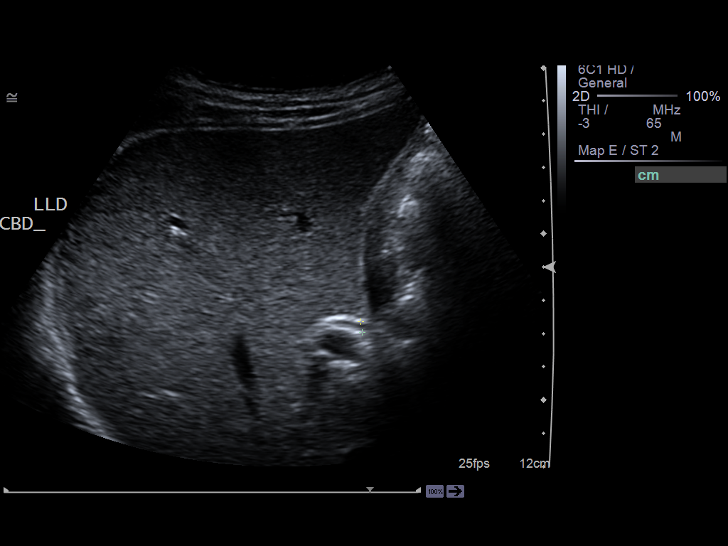
[im 35/47]
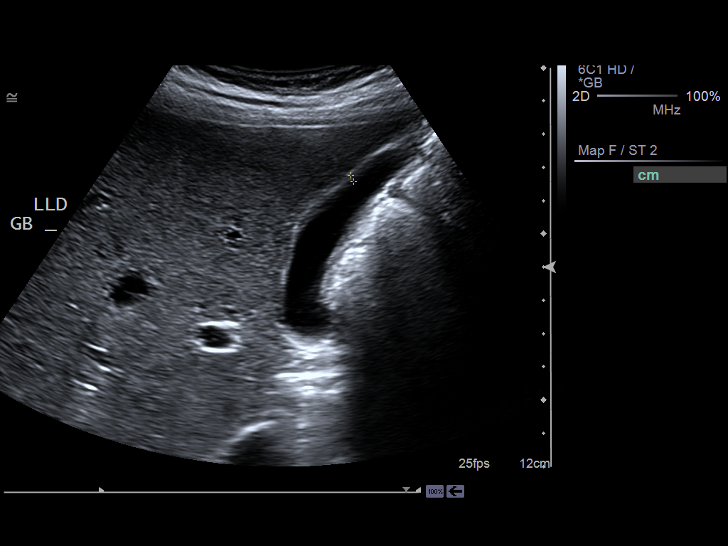
[im 39/47]
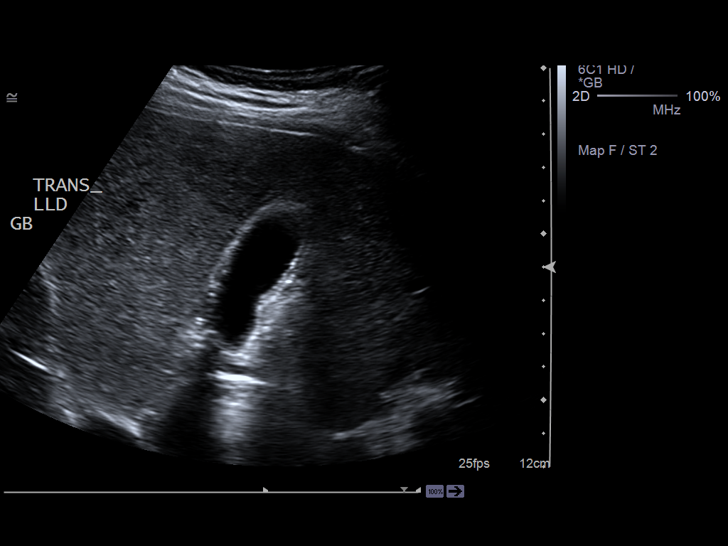
[im 43/47]
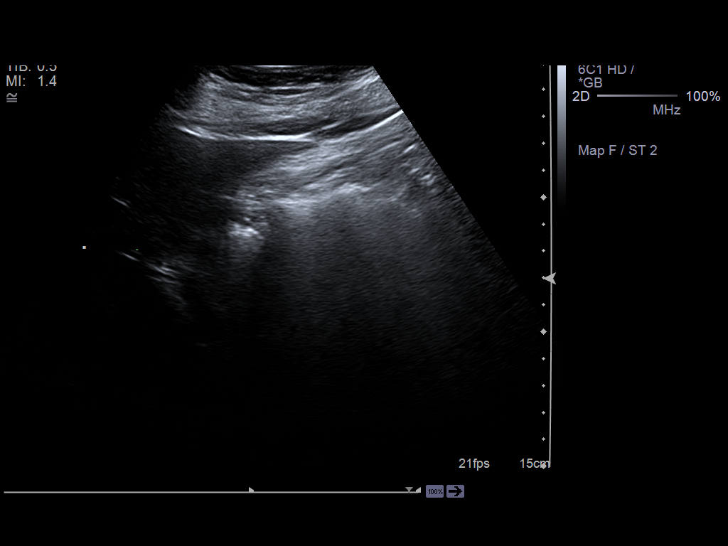
[im 47/47]
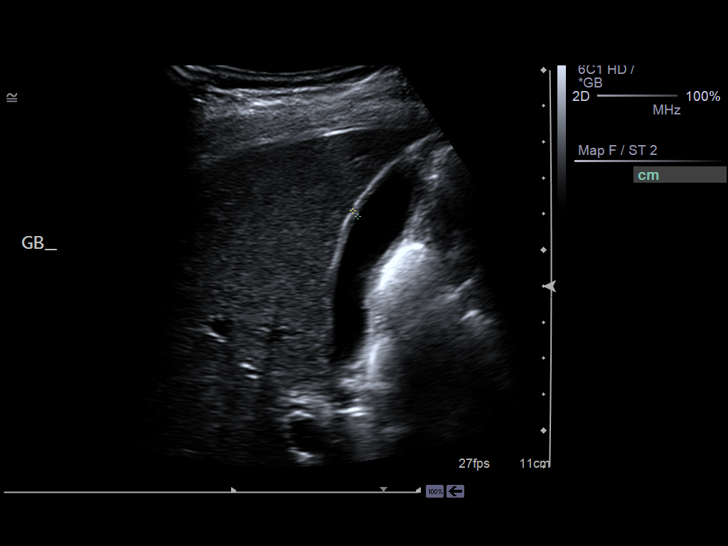

[14 of 25 positions shown; findings below may reference images not displayed]

FINDINGS: The pancreas was obscured by overlying bowel gas. The gallbladder is normal.
Sonographic Murphy sign was negative. The common bile duct measures 3 mm.
IMPRESSION: 1. Normal gallbladder.
2. The pancreas was obscured.

## 2013-07-14 NOTE — Progress Notes (Signed)
Pt stated that she did receive your message about A1c and has been doing what you instructed her to

## 2013-07-20 ENCOUNTER — Telehealth: Payer: Self-pay

## 2013-07-20 NOTE — Telephone Encounter (Signed)
Relevant patient education assigned to patient using Emmi. ° °

## 2013-10-30 ENCOUNTER — Other Ambulatory Visit: Payer: Self-pay

## 2013-10-30 ENCOUNTER — Other Ambulatory Visit: Payer: Self-pay | Admitting: Adult Health

## 2013-10-30 ENCOUNTER — Other Ambulatory Visit: Payer: Self-pay | Admitting: *Deleted

## 2013-10-30 MED ORDER — LISINOPRIL 20 MG PO TABS: ORAL_TABLET | ORAL | Status: DC

## 2014-03-22 ENCOUNTER — Other Ambulatory Visit: Payer: Self-pay | Admitting: *Deleted

## 2014-03-22 MED ORDER — LISINOPRIL 20 MG PO TABS: ORAL_TABLET | ORAL | Status: DC

## 2014-04-05 NOTE — Telephone Encounter (Signed)
Mailed unread message to pt  

## 2014-05-03 ENCOUNTER — Ambulatory Visit: Payer: Self-pay | Admitting: Family Medicine

## 2014-06-01 LAB — HM MAMMOGRAPHY

## 2014-07-08 ENCOUNTER — Telehealth: Payer: Self-pay | Admitting: *Deleted

## 2014-07-08 NOTE — Telephone Encounter (Signed)
Fax from pharmacy requesting refill on Humalog Insulin, last OV 4.10.15.  Review of chart appears that Rx never prescribed by Raquel.  Called pt left message to return call.  Advise refill

## 2014-07-08 NOTE — Telephone Encounter (Signed)
Will need to be seen. Thanks.

## 2014-07-16 NOTE — Op Note (Signed)
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   OPERATION PERFORMED: Transvaginal tape/mid urethral sling and cystoscopy.   ANESTHESIA USED: General.   PRIMARY SURGEON: Stoney Bang. Georgianne Fick, MD  ANESTHESIA: General.   ESTIMATED BLOOD LOSS: 25 mL   OPERATIVE FLUIDS: 700 mL   PREOPERATIVE ANTIBIOTICS: 2 grams of Ancef.   COMPLICATIONS: None.   FINDINGS: Normal cystoscopy without evidence of intrusion of the sling into the bladder, the lateral fornixes were also and intact time of the procedure.   SPECIMENS REMOVED: None.   IMPLANTS: Gynecare TVT Exact.    COMPLICATIONS: None.   PATIENT CONDITION FOLLOWING PROCEDURE: Stable.   PROCEDURE IN DETAIL: Risks, benefits, and alternatives of the procedure were discussed with the patient prior to proceeding to the Operating Room. The patient was taken Operating Room where she was placed under general endotracheal anesthesia. She was positioned in the dorsal lithotomy position using Allen stirrups. The patient was prepped and draped in the usual sterile fashion. A timeout procedure was performed. Attention was turned to the patient's pelvis. A Foley catheter was placed to drain the bladder. Next, a short weighted speculum was placed to allow visualization of the anterior vaginal wall. The anterior vaginal wall was grasped approximately 1 cm distal to the urethra. A 1.5 cm midline incision was then made above the midportion of the urethra. The edges of the incision were grasped with Allis clamps and the underlying tissue was dissected off the mucosal surface. The retropubic space was then developed using Metzenbaum scissors, and the operator's finger. This was repeated on the right as it had been on the  left. Next, the operator's finger was placed into the plane of dissection and the Gynecare TVT Exact sling was brought up through the dissected area hugging the pubic bone coming out approximately 2 cm  left of midline. This was then repeated on the right also coming up above the pubic bone approximately 2 cm right  of midline. The trocars were left in place and cystoscopy was performed. The bladder was noted to be intact with no intrusion of the sling mesh into the bladder cavity. The lateral fornixes was checked and noted to be intact. Of note: During placement of the trocars, a catheter guide was used to displace the bladder in the opposite direction from the sling arms. Protective sheath was removed from the sling after the sling had been placed. There was no tension noted on the sling and a 15 Pratt dilator was easily accommodated between the sling and urethra. The mesh was cut back to the level of the skin. The incisions were dressed with Dermabond. The vaginal incision was closed with 3-0 Monocryl in a running fashion. The tissue was somewhat friable and extension had been sustained of the incision closed urethral opening. Therefore, the patient will have her catheter in place for a one-week and have her voiding trial done in clinic. The sponge, needle, and instrument counts were correct x 2. The patient tolerated the procedure well and was taken to recovery room in stable condition.     ____________________________ Stoney Bang. Georgianne Fick, MD ams:cc D: 03/10/2013 23:11:22 ET T: 03/10/2013 23:34:02 ET JOB#: [id number]  cc: Stoney Bang. Georgianne Fick, MD, <Dictator> Conan Bowens Madelon Lips MD ELECTRONICALLY SIGNED 03/15/2013 19:06

## 2014-08-04 ENCOUNTER — Other Ambulatory Visit: Payer: Self-pay | Admitting: *Deleted

## 2014-08-04 ENCOUNTER — Telehealth: Payer: Self-pay | Admitting: *Deleted

## 2014-08-04 ENCOUNTER — Other Ambulatory Visit: Payer: Self-pay | Admitting: Nurse Practitioner

## 2014-08-04 MED ORDER — METFORMIN HCL 1000 MG PO TABS: 1000.0000 mg | ORAL_TABLET | Freq: Two times a day (BID) | ORAL | Status: DC

## 2014-08-04 NOTE — Telephone Encounter (Signed)
Okay to fill for 1 month only. Needs office visit ASAP.

## 2014-08-04 NOTE — Telephone Encounter (Signed)
Fax from pharmacy requesting Metformin 1000 mg.  Pt last OV 4.10.15.  Called pt left message on VM to return call for OV.  Please advise refill

## 2015-06-22 LAB — HM PAP SMEAR

## 2015-08-05 ENCOUNTER — Ambulatory Visit (INDEPENDENT_AMBULATORY_CARE_PROVIDER_SITE_OTHER)
Admission: EM | Admit: 2015-08-05 | Discharge: 2015-08-05 | Disposition: A | Discharge status: Other Acute Inpatient Hospital | Payer: Federal, State, Local not specified - PPO | Source: Home / Self Care | Attending: Family Medicine | Admitting: Family Medicine

## 2015-08-05 ENCOUNTER — Emergency Department
Admission: EM | Admit: 2015-08-05 | Discharge: 2015-08-05 | Disposition: A | Discharge status: Home / Self Care | Payer: Federal, State, Local not specified - PPO | Attending: Emergency Medicine | Admitting: Emergency Medicine

## 2015-08-05 ENCOUNTER — Encounter: Payer: Self-pay | Admitting: Emergency Medicine

## 2015-08-05 ENCOUNTER — Emergency Department: Payer: Federal, State, Local not specified - PPO

## 2015-08-05 DIAGNOSIS — Z79899 Other long term (current) drug therapy: Secondary | ICD-10-CM | POA: Diagnosis not present

## 2015-08-05 DIAGNOSIS — Z8249 Family history of ischemic heart disease and other diseases of the circulatory system: Secondary | ICD-10-CM | POA: Insufficient documentation

## 2015-08-05 DIAGNOSIS — Z9889 Other specified postprocedural states: Secondary | ICD-10-CM | POA: Insufficient documentation

## 2015-08-05 DIAGNOSIS — R200 Anesthesia of skin: Secondary | ICD-10-CM | POA: Insufficient documentation

## 2015-08-05 DIAGNOSIS — Z7984 Long term (current) use of oral hypoglycemic drugs: Secondary | ICD-10-CM

## 2015-08-05 DIAGNOSIS — E669 Obesity, unspecified: Secondary | ICD-10-CM

## 2015-08-05 DIAGNOSIS — R079 Chest pain, unspecified: Secondary | ICD-10-CM

## 2015-08-05 DIAGNOSIS — Z794 Long term (current) use of insulin: Secondary | ICD-10-CM | POA: Insufficient documentation

## 2015-08-05 DIAGNOSIS — I10 Essential (primary) hypertension: Secondary | ICD-10-CM | POA: Diagnosis not present

## 2015-08-05 DIAGNOSIS — E119 Type 2 diabetes mellitus without complications: Secondary | ICD-10-CM | POA: Insufficient documentation

## 2015-08-05 DIAGNOSIS — R0789 Other chest pain: Secondary | ICD-10-CM | POA: Insufficient documentation

## 2015-08-05 LAB — BASIC METABOLIC PANEL
Anion gap: 7 (ref 5–15)
BUN: 6 mg/dL (ref 6–20)
CHLORIDE: 105 mmol/L (ref 101–111)
CO2: 26 mmol/L (ref 22–32)
Calcium: 9.4 mg/dL (ref 8.9–10.3)
Creatinine, Ser: 0.46 mg/dL (ref 0.44–1.00)
Glucose, Bld: 144 mg/dL — ABNORMAL HIGH (ref 65–99)
POTASSIUM: 3.9 mmol/L (ref 3.5–5.1)
Sodium: 138 mmol/L (ref 135–145)

## 2015-08-05 LAB — CBC
HEMATOCRIT: 37.5 % (ref 35.0–47.0)
HEMOGLOBIN: 12.9 g/dL (ref 12.0–16.0)
MCH: 30.4 pg (ref 26.0–34.0)
MCHC: 34.5 g/dL (ref 32.0–36.0)
MCV: 88.1 fL (ref 80.0–100.0)
Platelets: 224 10*3/uL (ref 150–440)
RBC: 4.26 MIL/uL (ref 3.80–5.20)
RDW: 13.1 % (ref 11.5–14.5)
WBC: 6 10*3/uL (ref 3.6–11.0)

## 2015-08-05 LAB — TROPONIN I: Troponin I: <0.03 ng/mL (ref <0.031)

## 2015-08-05 MED ORDER — NITROGLYCERIN 0.4 MG SL SUBL: 0.4000 mg | SUBLINGUAL_TABLET | SUBLINGUAL | Status: DC | PRN

## 2015-08-05 MED ORDER — NITROGLYCERIN 0.4 MG SL SUBL
0.4000 mg | SUBLINGUAL_TABLET | SUBLINGUAL | Status: DC | PRN
  Administered 2015-08-05: 0.4 mg via SUBLINGUAL

## 2015-08-05 MED ORDER — ASPIRIN 81 MG PO CHEW
324.0000 mg | CHEWABLE_TABLET | Freq: Once | ORAL | Status: AC
  Administered 2015-08-05: 324 mg via ORAL

## 2015-08-05 MED ORDER — ASPIRIN EC 81 MG PO TBEC: 81.0000 mg | DELAYED_RELEASE_TABLET | Freq: Every day | ORAL | Status: DC

## 2015-08-05 NOTE — ED Notes (Signed)
Prematurely drew the next Troponin, called lab to discard this specimen taken, will redraw at original order 2140.

## 2015-08-05 NOTE — ED Notes (Signed)
Report given to David, RN.

## 2015-08-05 NOTE — ED Provider Notes (Signed)
[facility] Emergency Department Provider Note    ____________________________________________  Time seen: ~1740  I have reviewed the triage vital signs and the nursing notes.   HISTORY  Chief Complaint Chest Pain   History limited by: Not Limited   HPI Linda Roach is a 45 y.o. female who presents to the emergency department today because of concerns for chest pain. Patient is coming from urgent care. She states she went to urgent care today because of an episode of chest pain. The patient states that she first had an episode of some chest discomfort, nausea and some vomiting 5 days ago. She states that since that time she's felt a little weak although has not had any more chest pain until today. She had 2 episodes of chest pain today. 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 She describes it as being located in the left side of the chest. It was initially sharp. Did radiate up into the left side of her neck and face and down her left arm. She did receive aspirin and nitroglycerin at urgent care. She now states that the pain is a dull pain. It was initially associated with shortness of breath.She denies any diaphoresis or fevers.   Past Medical History  Diagnosis Date  . Diabetes mellitus without complication ([place]) [id number]    Type 2  . Hypertension 1998    Currently on lisinopril - well controlled  . Stress incontinence 2000    Patient Active Problem List   Diagnosis Date Noted  . Routine general medical examination at a health care facility 10/16/2012  . Papular rash, localized 10/16/2012  . Screening for breast cancer 10/16/2012  . Diabetes mellitus type 2, uncontrolled ([place]) 10/16/2012  . Hypertension 10/16/2012    Past Surgical History  Procedure Laterality Date  . Tonsillectomy  02/2013    Dr. Tami Ribas  . Incontinence surgery  03/10/13    Dr. Star Age    Current Outpatient Rx  Name  Route  Sig  Dispense  Refill   . acetaminophen (TYLENOL) 500 MG tablet   Oral   Take 500 mg by mouth daily as needed for pain.         Marland Kitchen glucose blood test strip      Use as instructed to check blood sugar three times daily. Pt has Contour Next EZ glucometer. Dx 250.00   100 each   11   . insulin detemir (LEVEMIR) 100 UNIT/ML injection   Subcutaneous   Inject 0.2 mLs (20 Units total) into the skin at bedtime.   10 mL   11   . insulin glargine (LANTUS) 100 UNIT/ML injection   Subcutaneous   Inject 25 Units into the skin at bedtime.         . insulin lispro (HUMALOG) 100 UNIT/ML injection      As needed for elevated blood sugar   10 mL   11   . levonorgestrel (MIRENA) 20 MCG/24HR IUD   Intrauterine   1 each by Intrauterine route once.         Marland Kitchen lisinopril (PRINIVIL,ZESTRIL) 20 MG tablet      TAKE 1 TABLET BY MOUTH EVERY DAY.   30 tablet   0     NEEDS OFFICE VISIT PRIOR TO FURTHER REFILLS, PLEAS ...   . metFORMIN (GLUCOPHAGE) 1000 MG tablet   Oral   Take 1 tablet (1,000 mg total) by mouth 2 (two) times daily with a meal.  60 tablet   0   . NON FORMULARY   Oral   Take 2 tablets by mouth daily. Green foods veggies for life supplement         . NON FORMULARY   Oral   Take 1 tablet by mouth daily. Hydroxycut         . omeprazole (PRILOSEC) 20 MG capsule   Oral   Take 20 mg by mouth daily.         Marland Kitchen triamcinolone cream (KENALOG) 0.1 %   Topical   Apply topically 2 (two) times daily.   30 g   0     Allergies Review of patient's allergies indicates no known allergies.  Family History  Problem Relation Age of Onset  . Heart disease Father 3    CAD s/p quadruple bypass  . Diabetes Father   . Diabetes Maternal Grandmother   . Cancer Paternal Grandmother     breast cancer  . Heart disease Paternal Grandmother   . Diabetes Paternal Grandmother   . Cancer Paternal Grandfather     prostate cancer  . Arthritis Brother   . Hyperlipidemia Brother   . Hypertension Brother      Social History Social History  Substance Use Topics  . Smoking status: Never Smoker   . Smokeless tobacco: Never Used  . Alcohol Use: Yes     Comment: Has a couple of drinks monthly    Review of Systems  Constitutional: Negative for fever. Cardiovascular: Positive for chest pain. Respiratory: Positive for shortness of breath. Gastrointestinal: Negative for abdominal pain, vomiting and diarrhea. Neurological: Negative for headaches, focal weakness or numbness.  10-point ROS otherwise negative.  ____________________________________________   PHYSICAL EXAM:  VITAL SIGNS: ED Triage Vitals  Enc Vitals Group     BP 08/05/15 1736 137/79 mmHg     Pulse Rate 08/05/15 1736 81     Resp 08/05/15 1736 16     Temp 08/05/15 1736 98.1 F (36.7 C)     Temp Source 08/05/15 1736 Oral     SpO2 08/05/15 1736 97 %     Weight 08/05/15 1736 173 lb (78.472 kg)     Height 08/05/15 1736 5\' 5"  (1.651 m)     Head Cir --      Peak Flow --    Constitutional: Alert and oriented. Well appearing and in no distress. Eyes: Conjunctivae are normal. PERRL. Normal extraocular movements. ENT   Head: Normocephalic and atraumatic.   Nose: No congestion/rhinnorhea.   Mouth/Throat: Mucous membranes are moist.   Neck: No stridor. Hematological/Lymphatic/Immunilogical: No cervical lymphadenopathy. Cardiovascular: Normal rate, regular rhythm.  No murmurs, rubs, or gallops. Respiratory: Normal respiratory effort without tachypnea nor retractions. Breath sounds are clear and equal bilaterally. No wheezes/rales/rhonchi. Gastrointestinal: Soft and nontender. No distention. There is no CVA tenderness. Genitourinary: Deferred Musculoskeletal: Normal range of motion in all extremities. No joint effusions.  No lower extremity tenderness nor edema. Neurologic:  Normal speech and language. No gross focal neurologic deficits are appreciated.  Skin:  Skin is warm, dry and intact. No rash  noted. Psychiatric: Mood and affect are normal. Speech and behavior are normal. Patient exhibits appropriate insight and judgment.  ____________________________________________    LABS (pertinent positives/negatives)  Labs Reviewed  BASIC METABOLIC PANEL - Abnormal; Notable for the following:    Glucose, Bld 144 (*)    All other components within normal limits  CBC  TROPONIN I  TROPONIN I     ____________________________________________   EKG  Apolonio Schneiders, attending physician, personally viewed and interpreted this EKG  EKG Time: 1734 Rate: 83 Rhythm: normal sinus rhythm Axis: normal Intervals: qtc 463 QRS: narrow ST changes: no st elevation Impression: normal ekg ____________________________________________    RADIOLOGY  CXR IMPRESSION: No active cardiopulmonary disease.  ____________________________________________   PROCEDURES  Procedure(s) performed: None  Critical Care performed: No  ____________________________________________   INITIAL IMPRESSION / ASSESSMENT AND PLAN / ED COURSE  Pertinent labs & imaging results that were available during my care of the patient were reviewed by me and considered in my medical decision making (see chart for details).  Patient with history of diabetes and hypertension who presents to the emergency department today because of concerns for chest pain. It is somewhat concerning given that it was radiating into her jaw and arm. It was also associated with some shortness breath. EKG however does not show any signs concerning for STEMI. Will plan on checking blood work and chest x-ray.  ----------------------------------------- 10:20 PM on 08/05/2015 -----------------------------------------  Chest x-ray and 2 sets of troponin negative. Patient did not have any return of significant pain here in the emergency department. I discussed with patient that I did not think that her heart was suffering any current  damage although I would still have some concern for possible angina. Will plan on discharging patient home with prescription for nitroglycerin. Additionally will have patient take baby aspirin daily. Did discuss with patient that I would like her to follow up with cardiology and will give cardiology referral. Discussed return precautions.  ____________________________________________   FINAL CLINICAL IMPRESSION(S) / ED DIAGNOSES  Final diagnoses:  Chest pain, unspecified chest pain type     Nance Pear, MD 08/05/15 2221

## 2015-08-05 NOTE — ED Provider Notes (Signed)
CSN: [id number]     Arrival date & time 08/05/15  76 History   First MD Initiated Contact with Patient 08/05/15 1641    Nurses notes were reviewed. Chief Complaint  Patient presents with  . Chest Pain    Patient is a 45 year old white female with long-standing history diabetes and hypertension who presented to the facility for chest pain 2. This morning she had an episode chest pain and breath way that was located in the left central his chest. Last for a few minutes and then went away. She states that just before arrival she was reaching to pick up something she experienced another episode this chest pain. This time the chest pain moved upper neck and also down the left arm. The pain was quite significant last for low while and then the pain going down the left arm finding cleared up. Along with the pain going down left arm shows had numbness down the left arm as well. She states she's still has chest discomfort with chest pressure and also along with chest pressure she's been having a sensation going up her left neck as well.   She has a history of diabetes for over 20 years hypertension she states that the last 2-3 years she's lost about 90 pounds. She does not smoke but is a strong family history of heart disease with her father having MI before the past  She's had pain now she states is about 5 out 6 and is deep in her chest and is along her neck as well. She does not take aspirin regular basis.  (Consider location/radiation/quality/duration/timing/severity/associated sxs/prior Treatment) Patient is a 45 y.o. female presenting with chest pain. The history is provided by the patient. No language interpreter was used.  Chest Pain Pain location:  Substernal area and L chest Pain quality: radiating   Pain radiates to:  Neck, L jaw, L arm and precordial region Pain radiates to the back: no   Pain severity:  Severe Onset quality:  Sudden Progression:  Partially resolved Chronicity:   New Context: movement, raising an arm and at rest   Relieved by:  Nothing Associated symptoms: nausea   Risk factors: diabetes mellitus and obesity     Past Medical History  Diagnosis Date  . Diabetes mellitus without complication ([place]) [id number]    Type 2  . Hypertension 1998    Currently on lisinopril - well controlled  . Stress incontinence 2000   Past Surgical History  Procedure Laterality Date  . Tonsillectomy  02/2013    Dr. Tami Ribas  . Incontinence surgery  03/10/13    Dr. Star Age   Family History  Problem Relation Age of Onset  . Heart disease Father 66    CAD s/p quadruple bypass  . Diabetes Father   . Diabetes Maternal Grandmother   . Cancer Paternal Grandmother     breast cancer  . Heart disease Paternal Grandmother   . Diabetes Paternal Grandmother   . Cancer Paternal Grandfather     prostate cancer  . Arthritis Brother   . Hyperlipidemia Brother   . Hypertension Brother    Social History  Substance Use Topics  . Smoking status: Never Smoker   . Smokeless tobacco: Never Used  . Alcohol Use: Yes     Comment: Has a couple of drinks monthly   OB History    No data available     Review of Systems  Cardiovascular: Positive for chest pain.  Gastrointestinal: Positive for nausea.  All other systems  reviewed and are negative.   Allergies  Review of patient's allergies indicates no known allergies.  Home Medications   Prior to Admission medications   Medication Sig Start Date End Date Taking? Authorizing Provider  insulin glargine (LANTUS) 100 UNIT/ML injection Inject 25 Units into the skin at bedtime.   Yes Historical Provider, MD  acetaminophen (TYLENOL) 500 MG tablet Take 500 mg by mouth daily as needed for pain.    Historical Provider, MD  glucose blood test strip Use as instructed to check blood sugar three times daily. Pt has Contour Next EZ glucometer. Dx 250.00 11/04/12   Raquel Dagoberto Ligas, NP  insulin detemir (LEVEMIR) 100 UNIT/ML injection Inject 0.2  mLs (20 Units total) into the skin at bedtime. 07/03/13   Raquel Dagoberto Ligas, NP  insulin lispro (HUMALOG) 100 UNIT/ML injection As needed for elevated blood sugar 07/03/13   Raquel Dagoberto Ligas, NP  levonorgestrel (MIRENA) 20 MCG/24HR IUD 1 each by Intrauterine route once.    Historical Provider, MD  lisinopril (PRINIVIL,ZESTRIL) 20 MG tablet TAKE 1 TABLET BY MOUTH EVERY DAY. 03/22/14   Rubbie Battiest, NP  metFORMIN (GLUCOPHAGE) 1000 MG tablet Take 1 tablet (1,000 mg total) by mouth 2 (two) times daily with a meal. 08/04/14   Rubbie Battiest, NP  NON FORMULARY Take 2 tablets by mouth daily. Green foods veggies for life supplement    Historical Provider, MD  NON FORMULARY Take 1 tablet by mouth daily. Hydroxycut    Historical Provider, MD  omeprazole (PRILOSEC) 20 MG capsule Take 20 mg by mouth daily.    Historical Provider, MD  triamcinolone cream (KENALOG) 0.1 % Apply topically 2 (two) times daily. 10/16/12   Sherryl Barters, NP   Meds Ordered and Administered this Visit   Medications  nitroGLYCERIN (NITROSTAT) SL tablet 0.4 mg (0.4 mg Sublingual Given 08/05/15 1647)  nitroGLYCERIN (NITROSTAT) SL tablet 0.4 mg (not administered)  aspirin chewable tablet 324 mg (324 mg Oral Given 08/05/15 1639)    BP 187/88 mmHg  Pulse 88  Temp(Src) 97 F (36.1 C) (Tympanic)  Resp 16  Ht 5\' 5"  (1.651 m)  Wt 173 lb (78.472 kg)  BMI 28.79 kg/m2  SpO2 100% No data found.   Physical Exam  Constitutional: She is oriented to person, place, and time. She appears well-developed and well-nourished.  HENT:  Head: Normocephalic and atraumatic.  Eyes: Conjunctivae are normal. Pupils are equal, round, and reactive to light.  Neck: Normal range of motion. Neck supple. No tracheal deviation present.  Cardiovascular: Normal rate, regular rhythm and normal heart sounds.   Pulmonary/Chest: Effort normal and breath sounds normal. No respiratory distress.    With deep chest wall palpation is able to reproduce some of patient's pain on  the left lateral borders but she states this still doesn't feel like there pain that she's having which is very deep and going up her neck.  Abdominal: Soft.  Musculoskeletal: Normal range of motion.  Neurological: She is alert and oriented to person, place, and time.  Skin: Skin is warm.  Psychiatric: She has a normal mood and affect.  Vitals reviewed.   ED Course  Procedures (including critical care time)  Labs Review Labs Reviewed - No data to display  Imaging Review No results found.   Visual Acuity Review  Right Eye Distance:   Left Eye Distance:   Bilateral Distance:    Right Eye Near:   Left Eye Near:    Bilateral Near:  MDM   1. Chest pain radiating to jaw   2. Essential hypertension    Transfer to [facility] by EMS. Discussed case with Lupita Leash at [facility] ED. Should be noted that 4 baby aspirin to be given saline lock will be started and will give patient a sublingual nitroglycerin since she still complaining of pain about 5 out of 10 in the left sternal border.  ED ECG REPORT I, Dameer Speiser H, the attending physician, personally viewed and interpreted this ECG.   Date: 08/05/2015  EKG Time: 16:27:58  Rate: 83  Rhythm: normal sinus rhythm,   Axis: 29  Intervals:none  ST&T Change: None suspicious specific ST changes or T-wave inversion in V3 is also a Q-wave in lead III, and poor R-wave progression on the chest leads  Note: This dictation was prepared with Dragon dictation along with smaller phrase technology. Any transcriptional errors that result from this process are unintentional.  Frederich Cha, MD 08/05/15 1704

## 2015-08-05 NOTE — Discharge Instructions (Signed)

## 2015-08-05 NOTE — Discharge Instructions (Signed)
Please seek medical attention for any high fevers, chest pain, shortness of breath, change in behavior, persistent vomiting, bloody stool or any other new or concerning symptoms. ° ° °Nonspecific Chest Pain °It is often hard to find the cause of chest pain. There is always a chance that your pain could be related to something serious, such as a heart attack or a blood clot in your lungs. Chest pain can also be caused by conditions that are not life-threatening. If you have chest pain, it is very important to follow up with your doctor. ° °HOME CARE °· If you were prescribed an antibiotic medicine, finish it all even if you start to feel better. °· Avoid any activities that cause chest pain. °· Do not use any tobacco products, including cigarettes, chewing tobacco, or electronic cigarettes. If you need help quitting, ask your doctor. °· Do not drink alcohol. °· Take medicines only as told by your doctor. °· Keep all follow-up visits as told by your doctor. This is important. This includes any further testing if your chest pain does not go away. °· Your doctor may tell you to keep your head raised (elevated) while you sleep. °· Make lifestyle changes as told by your doctor. These may include: °¨ Getting regular exercise. Ask your doctor to suggest some activities that are safe for you. °¨ Eating a heart-healthy diet. Your doctor or a diet specialist (dietitian) can help you to learn healthy eating options. °¨ Maintaining a healthy weight. °¨ Managing diabetes, if necessary. °¨ Reducing stress. °GET HELP IF: °· Your chest pain does not go away, even after treatment. °· You have a rash with blisters on your chest. °· You have a fever. °GET HELP RIGHT AWAY IF: °· Your chest pain is worse. °· You have an increasing cough, or you cough up blood. °· You have severe belly (abdominal) pain. °· You feel extremely weak. °· You pass out (faint). °· You have chills. °· You have sudden, unexplained chest discomfort. °· You have  sudden, unexplained discomfort in your arms, back, neck, or jaw. °· You have shortness of breath at any time. °· You suddenly start to sweat, or your skin gets clammy. °· You feel nauseous. °· You vomit. °· You suddenly feel light-headed or dizzy. °· Your heart begins to beat quickly, or it feels like it is skipping beats. °These symptoms may be an emergency. Do not wait to see if the symptoms will go away. Get medical help right away. Call your local emergency services (911 in the [place] Do not drive yourself to the hospital. °  °This information is not intended to replace advice given to you by your health care provider. Make sure you discuss any questions you have with your health care provider. °  °Document Released: 08/29/2007 Document Revised: 04/02/2014 Document Reviewed: 10/16/2013 °Elsevier Interactive Patient Education ©2016 [employer] ° °

## 2015-08-05 NOTE — ED Notes (Signed)
Pt comes into the ED via EMS from [facility] urgent care c/o chest pain on the left side and radiating into the left arm.  Patient given 324 asp at [facility] urgent care as well as 1 nitro where her pain went from a 10 to a 4.  Patient given another nitro in the EMS and pain is now at a 2.  Patient in NAD at this time with even and unlabored respirations.  NSR on EKG with EMS

## 2015-08-05 NOTE — ED Notes (Signed)
EMS notified to transport patient to [facility] ED.

## 2015-08-05 NOTE — ED Notes (Signed)
Patient c/o pain on the left side of her chest and tingling and numbness in her left arm that started around 12:00pm today.  Patient also reports SOB.

## 2016-06-26 ENCOUNTER — Other Ambulatory Visit: Payer: Self-pay | Admitting: Obstetrics and Gynecology

## 2016-07-04 ENCOUNTER — Encounter: Payer: Self-pay | Admitting: Obstetrics and Gynecology

## 2016-07-04 ENCOUNTER — Ambulatory Visit (INDEPENDENT_AMBULATORY_CARE_PROVIDER_SITE_OTHER): Payer: Federal, State, Local not specified - PPO | Admitting: Obstetrics and Gynecology

## 2016-07-04 DIAGNOSIS — Z01419 Encounter for gynecological examination (general) (routine) without abnormal findings: Secondary | ICD-10-CM | POA: Diagnosis not present

## 2016-07-04 DIAGNOSIS — Z1239 Encounter for other screening for malignant neoplasm of breast: Secondary | ICD-10-CM

## 2016-07-04 DIAGNOSIS — Z124 Encounter for screening for malignant neoplasm of cervix: Secondary | ICD-10-CM

## 2016-07-04 DIAGNOSIS — Z1231 Encounter for screening mammogram for malignant neoplasm of breast: Secondary | ICD-10-CM

## 2016-07-04 MED ORDER — VALACYCLOVIR HCL 500 MG PO TABS: 500.0000 mg | ORAL_TABLET | Freq: Every day | ORAL | 11 refills | Status: DC

## 2016-07-04 NOTE — Patient Instructions (Signed)
Preventive Care 18-39 Years, Female Preventive care refers to lifestyle choices and visits with your health care provider that can promote health and wellness. What does preventive care include?  A yearly physical exam. This is also called an annual well check.  Dental exams once or twice a year.  Routine eye exams. Ask your health care provider how often you should have your eyes checked.  Personal lifestyle choices, including:  Daily care of your teeth and gums.  Regular physical activity.  Eating a healthy diet.  Avoiding tobacco and drug use.  Limiting alcohol use.  Practicing safe sex.  Taking vitamin and mineral supplements as recommended by your health care provider. What happens during an annual well check? The services and screenings done by your health care provider during your annual well check will depend on your age, overall health, lifestyle risk factors, and family history of disease. Counseling  Your health care provider may ask you questions about your:  Alcohol use.  Tobacco use.  Drug use.  Emotional well-being.  Home and relationship well-being.  Sexual activity.  Eating habits.  Work and work environment.  Method of birth control.  Menstrual cycle.  Pregnancy history. Screening  You may have the following tests or measurements:  Height, weight, and BMI.  Diabetes screening. This is done by checking your blood sugar (glucose) after you have not eaten for a while (fasting).  Blood pressure.  Lipid and cholesterol levels. These may be checked every 5 years starting at age 20.  Skin check.  Hepatitis C blood test.  Hepatitis B blood test.  Sexually transmitted disease (STD) testing.  BRCA-related cancer screening. This may be done if you have a family history of breast, ovarian, tubal, or peritoneal cancers.  Pelvic exam and Pap test. This may be done every 3 years starting at age 21. Starting at age 30, this may be done every 5  years if you have a Pap test in combination with an HPV test. Discuss your test results, treatment options, and if necessary, the need for more tests with your health care provider. Vaccines  Your health care provider may recommend certain vaccines, such as:  Influenza vaccine. This is recommended every year.  Tetanus, diphtheria, and acellular pertussis (Tdap, Td) vaccine. You may need a Td booster every 10 years.  Varicella vaccine. You may need this if you have not been vaccinated.  HPV vaccine. If you are 26 or younger, you may need three doses over 6 months.  Measles, mumps, and rubella (MMR) vaccine. You may need at least one dose of MMR. You may also need a second dose.  Pneumococcal 13-valent conjugate (PCV13) vaccine. You may need this if you have certain conditions and were not previously vaccinated.  Pneumococcal polysaccharide (PPSV23) vaccine. You may need one or two doses if you smoke cigarettes or if you have certain conditions.  Meningococcal vaccine. One dose is recommended if you are age 19-21 years and a first-year college student living in a residence hall, or if you have one of several medical conditions. You may also need additional booster doses.  Hepatitis A vaccine. You may need this if you have certain conditions or if you travel or work in places where you may be exposed to hepatitis A.  Hepatitis B vaccine. You may need this if you have certain conditions or if you travel or work in places where you may be exposed to hepatitis B.  Haemophilus influenzae type b (Hib) vaccine. You may need this   if you have certain risk factors. Talk to your health care provider about which screenings and vaccines you need and how often you need them. This information is not intended to replace advice given to you by your health care provider. Make sure you discuss any questions you have with your health care provider. Document Released: 05/08/2001 Document Revised: 11/30/2015  Document Reviewed: 01/11/2015 Elsevier Interactive Patient Education  2017 [employer].

## 2016-07-04 NOTE — Progress Notes (Signed)
Patient ID: Linda Roach, female   DOB: [birth year], 46 y.o.   MRN: [id number]     Gynecology Annual Exam  PCP: Olevia Bowens, NP  Chief Complaint:  Chief Complaint  Patient presents with  . Medication Refill  . Gynecologic Exam    History of Present Illness: Patient is a 46 y.o. [id number] presents for annual exam. The patient has no complaints today.   LMP: No LMP recorded. Patient is not currently having periods (Reason: IUD). Intermenstrual Bleeding: no Postcoital Bleeding: no   The patient is sexually active. She currently uses IUD: Present: yes for contraception. She denies dyspareunia.  The patient does perform self breast exams.  There is no notable family history of breast or ovarian cancer in her family.  The patient wears seatbelts: yes.   The patient has regular exercise: not asked.    The patient denies current symptoms of depression.    Review of Systems: Review of Systems  Constitutional: Negative for chills and fever.  HENT: Negative for congestion.   Respiratory: Negative for cough and shortness of breath.   Cardiovascular: Negative for chest pain and palpitations.  Gastrointestinal: Negative for abdominal pain, constipation, diarrhea, heartburn, nausea and vomiting.  Genitourinary: Negative for dysuria, frequency and urgency.  Skin: Negative for itching and rash.  Neurological: Negative for dizziness and headaches.  Endo/Heme/Allergies: Negative for polydipsia.  Psychiatric/Behavioral: Negative for depression.    Past Medical History:  Past Medical History:  Diagnosis Date  . Diabetes mellitus without complication ([place]) 6578   Type 2  . Hypertension 1998   Currently on lisinopril - well controlled  . Stress incontinence 2000    Past Surgical History:  Past Surgical History:  Procedure Laterality Date  . INCONTINENCE SURGERY  03/10/13   Dr. Star Age  . TONSILLECTOMY  02/2013   Dr. Tami Ribas    Gynecologic History:  No LMP recorded. Patient is  not currently having periods (Reason: IUD). Contraception: IUD Last Pap: Results were: NIL and HR HPV negative 06/22/2015  Last mammogram: 06/01/2014 Results were: BI-RAD I Obstetric History: [id number]  Family History:  Family History  Problem Relation Age of Onset  . Heart disease Father 51    CAD s/p quadruple bypass  . Diabetes Father   . Arthritis Brother   . Hyperlipidemia Brother   . Hypertension Brother   . Diabetes Maternal Grandmother   . Cancer Paternal Grandmother     breast cancer  . Heart disease Paternal Grandmother   . Diabetes Paternal Grandmother   . Cancer Paternal Grandfather     prostate cancer    Social History:  Social History   Social History  . Marital status: Married    Spouse name: Araceli Bouche  . Number of children: 1  . 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 Smoking status: Never Smoker  . Smokeless tobacco: Never Used  . Alcohol use Yes     Comment: Has a couple of drinks monthly  . Drug use: No  . Sexual activity: Yes    Partners: Male    Birth control/ protection: IUD   Other Topics Concern  . Not on file   Social History Narrative   Shaleta grew up [place]. She is married to her husband of 22 years. They have a daughter. They have 3 dogs and 2 cats. They are currently living in [facility]. 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 She enjoys being outdoors. She tries to walk daily whenever possible. She loves the outdoors. Loves to [employer] [employer] and garden.    Allergies:  Allergies  Allergen Reactions  . Kiwi Extract     Other reaction(s): Other (See Comments) Mouth blister.    Medications: Prior to Admission medications   Medication Sig Start Date End Date Taking? Authorizing Provider  acetaminophen (TYLENOL) 500 MG tablet Take 500 mg by mouth daily as needed for pain.   Yes Historical Provider, MD  insulin glargine (LANTUS) 100  UNIT/ML injection Inject 25 Units into the skin at bedtime.   Yes Historical Provider, MD  insulin lispro (HUMALOG) 100 UNIT/ML injection As needed for elevated blood sugar 07/03/13  Yes Raquel Dagoberto Ligas, NP  levonorgestrel (MIRENA) 20 MCG/24HR IUD 1 each by Intrauterine route once.   Yes Historical Provider, MD  lisinopril (PRINIVIL,ZESTRIL) 20 MG tablet TAKE 1 TABLET BY MOUTH EVERY DAY. 03/22/14  Yes Rubbie Battiest, NP  metFORMIN (GLUCOPHAGE) 1000 MG tablet Take 1 tablet (1,000 mg total) by mouth 2 (two) times daily with a meal. 08/04/14  Yes Rubbie Battiest, NP    Physical Exam Vitals: Blood pressure 130/82, pulse 84, height 5\' 5"  (1.651 m), weight 184 lb (83.5 kg).  General: NAD HEENT: normocephalic, anicteric Thyroid: no enlargement, no palpable nodules Pulmonary: No increased work of breathing, CTAB Cardiovascular: RRR, distal pulses 2+ Breast: Breast symmetrical, no tenderness, no palpable nodules or masses, no skin or nipple retraction present, no nipple discharge.  No axillary or supraclavicular lymphadenopathy. Abdomen: NABS, soft, non-tender, non-distended.  Umbilicus without lesions.  No hepatomegaly, splenomegaly or masses palpable. No evidence of hernia  Genitourinary:  External: Normal external female genitalia.  Normal urethral meatus, normal  Bartholin's and Skene's glands.    Vagina: Normal vaginal mucosa, no evidence of prolapse.    Cervix: Grossly normal in appearance, no bleeding, IUD strings visualized 2cm  Uterus: Non-enlarged, mobile, normal contour.  No CMT  Adnexa: ovaries non-enlarged, no adnexal masses  Rectal: deferred  Lymphatic: no evidence of inguinal lymphadenopathy Extremities: no edema, erythema, or tenderness Neurologic: Grossly intact Psychiatric: mood appropriate, affect full  Female chaperone present for pelvic and breast  portions of the physical exam    Assessment: 46 y.o. [id number] routine annual exam  Plan: Problem List Items Addressed This Visit      None    Visit Diagnoses    Screening for malignant neoplasm of cervix       Relevant Orders   PapIG, HPV, rfx 16/18 (Completed)   Breast screening       Relevant Orders   MM DIGITAL SCREENING BILATERAL   Encounter for gynecological examination without abnormal finding       Relevant Orders   PapIG, HPV, rfx 16/18 (Completed)      1) Mammogram - recommend yearly screening mammogram.  Mammogram Was ordered today   2) STI screening was offered and declined  3) ASCCP guidelines and rational discussed.  Patient opts for every 3 years screening interval  4) Contraception - IUD in place  5) Routine healthcare maintenance including cholesterol, diabetes screening discussed managed by PCP

## 2016-07-06 LAB — PAPIG, HPV, RFX 16/18
HPV, high-risk: NEGATIVE
PAP SMEAR COMMENT: 0

## 2016-07-09 ENCOUNTER — Encounter: Payer: Self-pay | Admitting: Obstetrics and Gynecology

## 2016-11-05 ENCOUNTER — Telehealth: Payer: Self-pay | Admitting: Obstetrics and Gynecology

## 2016-11-05 NOTE — Telephone Encounter (Signed)
Pt can try primary care or schedule with a different provider here. May need referral to urology.

## 2016-11-05 NOTE — Telephone Encounter (Signed)
Patient is having recurring bladder issues.  She is not sure how to handle or what to do.  No available appointments until after AMS vacation, if patient should be seen.

## 2016-11-06 ENCOUNTER — Ambulatory Visit (INDEPENDENT_AMBULATORY_CARE_PROVIDER_SITE_OTHER): Payer: Federal, State, Local not specified - PPO | Admitting: Obstetrics and Gynecology

## 2016-11-06 ENCOUNTER — Encounter: Payer: Self-pay | Admitting: Obstetrics and Gynecology

## 2016-11-06 VITALS — BP 142/88 | HR 90 | Ht 65.0 in | Wt 174.0 lb

## 2016-11-06 DIAGNOSIS — N3000 Acute cystitis without hematuria: Secondary | ICD-10-CM

## 2016-11-06 DIAGNOSIS — Z113 Encounter for screening for infections with a predominantly sexual mode of transmission: Secondary | ICD-10-CM

## 2016-11-06 DIAGNOSIS — K120 Recurrent oral aphthae: Secondary | ICD-10-CM | POA: Diagnosis not present

## 2016-11-06 LAB — POCT URINALYSIS DIPSTICK
Bilirubin, UA: NEGATIVE
GLUCOSE UA: NEGATIVE
KETONES UA: NEGATIVE
Nitrite, UA: NEGATIVE
RBC UA: NEGATIVE
pH, UA: 6 (ref 5.0–8.0)

## 2016-11-06 MED ORDER — NITROFURANTOIN MONOHYD MACRO 100 MG PO CAPS: 100.0000 mg | ORAL_CAPSULE | Freq: Two times a day (BID) | ORAL | 0 refills | Status: AC

## 2016-11-06 NOTE — Progress Notes (Signed)
Chief Complaint  Patient presents with  . Urinary Tract Infection    mouth sores    HPI:      Linda Roach is a 46 y.o. [phone] who LMP was Patient's last menstrual period was 10/27/2016., presents today for UTI sx and STD testing. She complains of urinary frequency, urgency, pelvic pressure, malodorous urine, and occas incont for the past wk. No hematuria/dysuria/LBP/fevers. She has a hx of UTIs, about 5 last yr and 2 so far this yr. She drinks lots of diet coke and some water. She holds her urine during the day due to her job. She had vaginal bladder sling about 3 yrs ago due to incont.  She also would like STD testing. She had a new partner recently and wants to be safe. No known exposures/no vag sx. She has a hx of HSV2. She has had mouth sores for the past 5 days and is concerned. She has been treating with oragel canker sore mouthwash.      Past Medical History:  Diagnosis Date  . Diabetes mellitus without complication ([place]) 0177   Type 1 - DR. Curt Jews AT [facility] ENDOCRINOLOGY  . Genital warts   . History of Papanicolaou smear of cervix 02/21/2012L 06/22/2015   -/-; -/-  . Hyperlipidemia   . Hypertension 1998   Currently on lisinopril - well controlled  . Leiomyoma of uterus 2014, 2017   ESSENTIALLY 3X3, 3X3  . Stress incontinence 2000    Past Surgical History:  Procedure Laterality Date  . INCONTINENCE SURGERY  03/10/13   Dr. Star Age  . INTRAUTERINE DEVICE (IUD) INSERTION  06/08/2010  . TONSILLECTOMY  02/2013   Dr. Tami Ribas    Family History  Problem Relation Age of Onset  . Heart disease Father 58       CAD s/p quadruple bypass  . Diabetes Father        TYPE 2  . Arthritis Brother   . Hyperlipidemia Brother   . Hypertension Brother   . Diabetes Maternal Grandmother   . Cancer Maternal Grandmother 60       COLON  . Cancer Paternal Grandmother 34       breast cancer  . Heart disease Paternal Grandmother   . Diabetes Paternal Grandmother   .  Cancer Paternal Grandfather 29       prostate cancer    Social History   Social History  . Marital status: Married    Spouse name: Araceli Bouche  . Number of children: 1  . 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 Smoking status: Never Smoker  . Smokeless tobacco: Never Used  . Alcohol use Yes     Comment: Has a couple of drinks monthly  . Drug use: No  . Sexual activity: Yes    Partners: Male    Birth control/ protection: IUD   Other Topics Concern  . Not on file   Social History Narrative   Kellee grew up [employer]. She is married to her husband of 22 years. They have a daughter. They have 3 dogs and 2 cats. They are currently living in [facility]. 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 She enjoys being outdoors. She tries to walk daily whenever possible. She loves the outdoors. Loves to [employer] and garden.     Current Outpatient Prescriptions:  .  acetaminophen (  TYLENOL) 500 MG tablet, Take 500 mg by mouth daily as needed for pain., Disp: , Rfl:  .  insulin glargine (LANTUS) 100 UNIT/ML injection, Inject 25 Units into the skin at bedtime., Disp: , Rfl:  .  insulin lispro (HUMALOG) 100 UNIT/ML injection, As needed for elevated blood sugar, Disp: 10 mL, Rfl: 11 .  levonorgestrel (MIRENA) 20 MCG/24HR IUD, 1 each by Intrauterine route once., Disp: , Rfl:  .  lisinopril (PRINIVIL,ZESTRIL) 20 MG tablet, TAKE 1 TABLET BY MOUTH EVERY DAY., Disp: 30 tablet, Rfl: 0 .  metFORMIN (GLUCOPHAGE) 1000 MG tablet, Take 1 tablet (1,000 mg total) by mouth 2 (two) times daily with a meal., Disp: 60 tablet, Rfl: 0 .  nitrofurantoin, macrocrystal-monohydrate, (MACROBID) 100 MG capsule, Take 1 capsule (100 mg total) by mouth 2 (two) times daily., Disp: 14 capsule, Rfl: 0 .  valACYclovir (VALTREX) 500 MG tablet, Take 1 tablet (500 mg total) by mouth daily., Disp: 30 tablet, Rfl: 11   ROS:  Review  of Systems  Constitutional: Negative for fever.  HENT: Positive for mouth sores.   Gastrointestinal: Negative for blood in stool, constipation, diarrhea, nausea and vomiting.  Genitourinary: Positive for difficulty urinating, frequency and urgency. Negative for dyspareunia, dysuria, flank pain, hematuria, vaginal bleeding, vaginal discharge and vaginal pain.  Musculoskeletal: Negative for back pain.  Skin: Negative for rash.     OBJECTIVE:   Vitals:  BP (!) 142/88   Pulse 90   Ht 5\' 5"  (1.651 m)   Wt 174 lb (78.9 kg)   LMP 10/27/2016   BMI 28.96 kg/m   Physical Exam  Constitutional: She is oriented to person, place, and time and well-developed, well-nourished, and in no distress. Vital signs are normal.  HENT:  Mouth/Throat: Oral lesions present.  2 APHTHOUS ULCERS PRESENT  Genitourinary: Vagina normal, uterus normal, cervix normal, right adnexa normal, left adnexa normal and vulva normal. Uterus is not enlarged. Cervix exhibits no motion tenderness and no tenderness. Right adnexum displays no mass and no tenderness. Left adnexum displays no mass and no tenderness. Vulva exhibits no erythema, no exudate, no lesion, no rash and no tenderness. Vagina exhibits no lesion.  Neurological: She is oriented to person, place, and time.  Vitals reviewed.   Results: Results for orders placed or performed in visit on 11/06/16 (from the past 24 hour(s))  POCT Urinalysis Dipstick     Status: Abnormal   Collection Time: 11/06/16  5:23 PM  Result Value Ref Range   Color, UA PALE    Clarity, UA CLEAR    Glucose, UA NEG    Bilirubin, UA NEG    Ketones, UA NEG    Spec Grav, UA <=1.005 (A) 1.010 - 1.025   Blood, UA NEG    pH, UA 6.0 5.0 - 8.0   Protein, UA TRACE    Urobilinogen, UA  0.2 or 1.0 E.U./dL   Nitrite, UA NEG    Leukocytes, UA Moderate (2+) (A) Negative     Assessment/Plan: Acute cystitis without hematuria - Rx macrobid. Check C&S. Discussed d/c diet soda/increase water  intake and voiding frequency.  - Plan: Urine Culture, POCT Urinalysis Dipstick, nitrofurantoin, macrocrystal-monohydrate, (MACROBID) 100 MG capsule  Screening for STD (sexually transmitted disease) - Will notify pt of results. - Plan: Chlamydia/Gonococcus/Trichomonas, NAA, HIV antibody, RPR, Hepatitis C antibody  Aphthous ulcer - Try warm salt water rinse. Reassurance. Pt under increased stress. F/u prn.    Meds ordered this encounter  Medications  . nitrofurantoin, macrocrystal-monohydrate, (MACROBID) 100 MG  capsule    Sig: Take 1 capsule (100 mg total) by mouth 2 (two) times daily.    Dispense:  14 capsule    Refill:  0      Return if symptoms worsen or fail to improve.  Jaxie Racanelli B. Jaskirat Schwieger, PA-C 11/06/2016 5:25 PM

## 2016-11-07 ENCOUNTER — Other Ambulatory Visit: Payer: Self-pay

## 2016-11-07 DIAGNOSIS — Z113 Encounter for screening for infections with a predominantly sexual mode of transmission: Secondary | ICD-10-CM

## 2016-11-09 ENCOUNTER — Encounter: Payer: Self-pay | Admitting: Obstetrics and Gynecology

## 2016-11-09 LAB — URINE CULTURE

## 2016-11-09 LAB — CHLAMYDIA/GONOCOCCUS/TRICHOMONAS, NAA
CHLAMYDIA BY NAA: NEGATIVE
GONOCOCCUS BY NAA: NEGATIVE
Trich vag by NAA: NEGATIVE

## 2016-11-10 LAB — CHLAMYDIA/GONOCOCCUS/TRICHOMONAS, NAA
Chlamydia by NAA: NEGATIVE
Gonococcus by NAA: NEGATIVE
TRICH VAG BY NAA: NEGATIVE

## 2016-11-14 ENCOUNTER — Encounter: Payer: Self-pay | Admitting: Obstetrics and Gynecology

## 2016-11-14 LAB — HIV ANTIBODY (ROUTINE TESTING W REFLEX): HIV Screen 4th Generation wRfx: NONREACTIVE

## 2016-11-14 LAB — HEPATITIS C ANTIBODY: Hep C Virus Ab: <0.1 s/co ratio (ref 0.0–0.9)

## 2016-11-14 LAB — RPR: RPR: NONREACTIVE

## 2017-08-07 ENCOUNTER — Other Ambulatory Visit: Payer: Self-pay | Admitting: Obstetrics and Gynecology

## 2017-10-15 ENCOUNTER — Ambulatory Visit
Admission: EM | Admit: 2017-10-15 | Discharge: 2017-10-15 | Disposition: A | Discharge status: Home / Self Care | Payer: Federal, State, Local not specified - PPO | Attending: Family Medicine | Admitting: Family Medicine

## 2017-10-15 ENCOUNTER — Encounter: Payer: Self-pay | Admitting: Emergency Medicine

## 2017-10-15 ENCOUNTER — Ambulatory Visit (INDEPENDENT_AMBULATORY_CARE_PROVIDER_SITE_OTHER): Payer: Federal, State, Local not specified - PPO

## 2017-10-15 ENCOUNTER — Other Ambulatory Visit: Payer: Self-pay

## 2017-10-15 DIAGNOSIS — S93601A Unspecified sprain of right foot, initial encounter: Secondary | ICD-10-CM | POA: Diagnosis not present

## 2017-10-15 NOTE — ED Provider Notes (Signed)
[facility]    CSN: [id number] Arrival date & time: 10/15/17  6387     History   Chief Complaint Chief Complaint  Patient presents with  . Foot Pain    right    HPI Linda Roach is a 47 y.o. female.   HPI  47 year old female presents with right foot pain.  She states that last night she was barefoot in her house hurrying and stepped into her garage feeling and hearing a pop on the top of her foot.  Now she has pain in the mid  Foot over the second and third metatarsals distally to about midshaft.  There is mild swelling no ecchymosis is present.  She states that she has pain with any attempt to ambulate normally and is able now to only walk on her heel.        Past Medical History:  Diagnosis Date  . Diabetes mellitus without complication ([place]) 5643   Type 1 - DR. Curt Jews AT [facility] ENDOCRINOLOGY  . Genital warts   . History of Papanicolaou smear of cervix 02/21/2012L 06/22/2015   -/-; -/-  . Hyperlipidemia   . Hypertension 1998   Currently on lisinopril - well controlled  . Leiomyoma of uterus 2014, 2017   ESSENTIALLY 3X3, 3X3  . Stress incontinence 2000    Patient Active Problem List   Diagnosis Date Noted  . Routine general medical examination at a health care facility 10/16/2012  . Papular rash, localized 10/16/2012  . Screening for breast cancer 10/16/2012  . Diabetes mellitus type 2, uncontrolled ([place]) 10/16/2012  . Hypertension 10/16/2012    Past Surgical History:  Procedure Laterality Date  . INCONTINENCE SURGERY  03/10/13   Dr. Star Age  . INTRAUTERINE DEVICE (IUD) INSERTION  06/08/2010  . TONSILLECTOMY  02/2013   Dr. Tami Ribas    OB History    Gravida  4   Para  1   Term  1   Preterm      AB  3   Living  1     SAB      TAB      Ectopic      Multiple      Live Births  1            Home Medications    Prior to Admission medications   Medication Sig Start Date End Date Taking? Authorizing Provider    acetaminophen (TYLENOL) 500 MG tablet Take 500 mg by mouth daily as needed for pain.   Yes [provider]  insulin glargine (LANTUS) 100 UNIT/ML injection Inject 25 Units into the skin at bedtime.   Yes [provider]  insulin lispro (HUMALOG) 100 UNIT/ML injection As needed for elevated blood sugar 07/03/13  Yes Rey, Raquel M, NP  levonorgestrel (MIRENA) 20 MCG/24HR IUD 1 each by Intrauterine route once.   Yes [provider]  lisinopril (PRINIVIL,ZESTRIL) 20 MG tablet TAKE 1 TABLET BY MOUTH EVERY DAY. 03/22/14  Yes Doss, Velora Heckler, RN  metFORMIN (GLUCOPHAGE) 1000 MG tablet Take 1 tablet (1,000 mg total) by mouth 2 (two) times daily with a meal. 08/04/14  Yes Doss, Velora Heckler, RN  valACYclovir (VALTREX) 500 MG tablet TAKE 1 TABLET(500 MG) BY MOUTH DAILY 08/07/17  Yes Malachy Mood, MD    Family History Family History  Problem Relation Age of Onset  . Heart disease Father 21       CAD s/p quadruple bypass  . Diabetes Father  TYPE 2  . Arthritis Brother   . Hyperlipidemia Brother   . Hypertension Brother   . Diabetes Maternal Grandmother   . Cancer Maternal Grandmother 60       COLON  . Cancer Paternal Grandmother 34       breast cancer  . Heart disease Paternal Grandmother   . Diabetes Paternal Grandmother   . Cancer Paternal Grandfather 16       prostate cancer    Social History Social History   Tobacco Use  . Smoking status: Never Smoker  . Smokeless tobacco: Never Used  Substance Use Topics  . Alcohol use: Yes    Comment: Has a couple of drinks monthly  . Drug use: No     Allergies   Kiwi extract   Review of Systems Review of Systems  Constitutional: Positive for activity change. Negative for appetite change, diaphoresis, fatigue and fever.  Musculoskeletal: Positive for gait problem and myalgias.  All other systems reviewed and are negative.    Physical Exam Triage Vital Signs ED Triage Vitals  Enc Vitals Group     BP  10/15/17 0958 127/87     Pulse Rate 10/15/17 0958 75     Resp 10/15/17 0958 16     Temp 10/15/17 0958 98.4 F (36.9 C)     Temp Source 10/15/17 0958 Oral     SpO2 10/15/17 0958 100 %     Weight 10/15/17 0958 180 lb (81.6 kg)     Height 10/15/17 0958 5\' 5"  (1.651 m)     Head Circumference --      Peak Flow --      Pain Score 10/15/17 0956 8     Pain Loc --      Pain Edu? --      Excl. in [place]? --    No data found.  Updated Vital Signs BP 127/87 (BP Location: Left Arm)   Pulse 75   Temp 98.4 F (36.9 C) (Oral)   Resp 16   Ht 5\' 5"  (1.651 m)   Wt 180 lb (81.6 kg)   SpO2 100%   BMI 29.95 kg/m   Visual Acuity Right Eye Distance:   Left Eye Distance:   Bilateral Distance:    Right Eye Near:   Left Eye Near:    Bilateral Near:     Physical Exam  Constitutional: She is oriented to person, place, and time. She appears well-developed and well-nourished. No distress.  HENT:  Head: Normocephalic.  Eyes: Pupils are equal, round, and reactive to light. Right eye exhibits no discharge. Left eye exhibits no discharge.  Neck: Normal range of motion.  Musculoskeletal: Normal range of motion. She exhibits edema and tenderness. She exhibits no deformity.  Emanation of the right foot shows only mild swelling over the dorsum.  No significant ecchymosis or erythema present. Maximum tender over the second and third metatarsals along the shaft particularly midshaft and to the base of the metatarsals.  Ankle range of motion is full and comfortable.  There is  Good comfy subtalar motion present.  There is no pain with compression of the distal tib-fib.  Neurological: She is alert and oriented to person, place, and time.  Skin: Skin is warm and dry. She is not diaphoretic.  Psychiatric: She has a normal mood and affect. Her behavior is normal. Judgment and thought content normal.  Nursing note and vitals reviewed.    UC Treatments / Results  Labs (all labs ordered are listed, but only  abnormal results are displayed) Labs Reviewed - No data to display  EKG None  Radiology Dg Foot Complete Right  Result Date: 10/15/2017 CLINICAL DATA:  Acute onset pain EXAM: RIGHT FOOT COMPLETE - 3+ VIEW COMPARISON:  None. FINDINGS: Frontal, oblique, and lateral views obtained. There is no evident fracture or dislocation. Joint spaces appear normal. No erosive change. IMPRESSION: No fracture or dislocation.  No evident arthropathy. Electronically Signed   By: Lowella Grip III M.D.   On: 10/15/2017 10:38    Procedures Procedures (including critical care time)  Medications Ordered in UC Medications - No data to display  Initial Impression / Assessment and Plan / UC Course  I have reviewed the triage vital signs and the nursing notes.  Pertinent labs & imaging results that were available during my care of the patient were reviewed by me and considered in my medical decision making (see chart for details).     Plan: 1. Test/x-ray results and diagnosis reviewed with patient 2. rx as per orders; risks, benefits, potential side effects reviewed with patient 3. Recommend supportive treatment with ice and elevation to control pain and swelling.  Provide her with a postop shoe and Ace wrap.  She will follow-up with Dr. Vickki Muff if she is not improving 4. F/u prn if symptoms worsen or don't improve  Final Clinical Impressions(s) / UC Diagnoses   Final diagnoses:  Sprain of right foot, initial encounter     Discharge Instructions     Rest and symptom avoidance.Apply ice 20 minutes out of every 2 hours 4-5 times daily for comfort.  Follow up with podiatry if not improving    ED Prescriptions    None     Controlled Substance Prescriptions [place] Controlled Substance Registry consulted? Not Applicable   Lorin Picket, PA-C 10/15/17 1333

## 2017-10-15 NOTE — ED Triage Notes (Signed)
Pt here today c/o right foot pain. She stepped out of her house into her garage last night barefoot and heard a pop of the top of her foot. She now has pain, mild swelling and redness. Weight bearing makes pain worse.

## 2017-10-15 NOTE — Discharge Instructions (Addendum)
Rest and symptom avoidance.Apply ice 20 minutes out of every 2 hours 4-5 times daily for comfort.  Follow up with podiatry if not improving

## 2018-01-21 ENCOUNTER — Encounter: Payer: Self-pay | Admitting: Obstetrics and Gynecology

## 2018-01-22 ENCOUNTER — Ambulatory Visit (INDEPENDENT_AMBULATORY_CARE_PROVIDER_SITE_OTHER): Payer: Federal, State, Local not specified - PPO | Admitting: Obstetrics and Gynecology

## 2018-01-22 ENCOUNTER — Telehealth: Payer: Self-pay | Admitting: Obstetrics and Gynecology

## 2018-01-22 ENCOUNTER — Encounter: Payer: Self-pay | Admitting: Obstetrics and Gynecology

## 2018-01-22 ENCOUNTER — Other Ambulatory Visit (HOSPITAL_COMMUNITY)
Admission: RE | Admit: 2018-01-22 | Discharge: 2018-01-22 | Disposition: A | Discharge status: Home / Self Care | Payer: Federal, State, Local not specified - PPO | Source: Ambulatory Visit | Attending: Obstetrics and Gynecology | Admitting: Obstetrics and Gynecology

## 2018-01-22 VITALS — BP 140/82 | HR 94 | Ht 65.0 in | Wt 179.0 lb

## 2018-01-22 DIAGNOSIS — Z1239 Encounter for other screening for malignant neoplasm of breast: Secondary | ICD-10-CM

## 2018-01-22 DIAGNOSIS — R309 Painful micturition, unspecified: Secondary | ICD-10-CM

## 2018-01-22 DIAGNOSIS — Z01419 Encounter for gynecological examination (general) (routine) without abnormal findings: Secondary | ICD-10-CM | POA: Insufficient documentation

## 2018-01-22 DIAGNOSIS — Z124 Encounter for screening for malignant neoplasm of cervix: Secondary | ICD-10-CM

## 2018-01-22 DIAGNOSIS — Z23 Encounter for immunization: Secondary | ICD-10-CM

## 2018-01-22 LAB — POCT URINALYSIS DIPSTICK
BILIRUBIN UA: NEGATIVE
GLUCOSE UA: NEGATIVE
Nitrite, UA: NEGATIVE
Protein, UA: POSITIVE — AB
SPEC GRAV UA: 1.02 (ref 1.010–1.025)
Urobilinogen, UA: NEGATIVE E.U./dL — AB
pH, UA: 7.5 (ref 5.0–8.0)

## 2018-01-22 MED ORDER — NITROFURANTOIN MONOHYD MACRO 100 MG PO CAPS: 100.0000 mg | ORAL_CAPSULE | Freq: Two times a day (BID) | ORAL | 0 refills | Status: AC

## 2018-01-22 NOTE — Progress Notes (Signed)
Gynecology Annual Exam  PCP: Linda Barters, NP  Chief Complaint:  Chief Complaint  Patient presents with  . Gynecologic Exam    Hormonal issues/weight gain  . Low abdominal pain    UTI?    History of Present Illness: Patient is a 47 y.o. [id number] presents for annual exam. The patient has no complaints today.   LMP: No LMP recorded. (Menstrual status: IUD).   The patient is sexually active. She currently uses IUD for contraception. She denies dyspareunia.  The patient does perform self breast exams.  There is no notable family history of breast or ovarian cancer in her family.  The patient wears seatbelts: yes.   The patient has regular exercise: no.    The patient denies current symptoms of depression.    She has been experiencing urinary frequency, dysuria, and suprapubic discomfort   Review of Systems: Review of Systems  Constitutional: Negative for chills and fever.  HENT: Negative for congestion.   Respiratory: Negative for cough and shortness of breath.   Cardiovascular: Negative for chest pain and palpitations.  Gastrointestinal: Negative for abdominal pain, constipation, diarrhea, heartburn, nausea and vomiting.  Genitourinary: Positive for dysuria, frequency and urgency.  Skin: Negative for itching and rash.  Neurological: Negative for dizziness and headaches.  Endo/Heme/Allergies: Negative for polydipsia.  Psychiatric/Behavioral: Negative for depression.    Past Medical History:  Past Medical History:  Diagnosis Date  . Diabetes mellitus without complication ([place]) 0240   Type 1 - DR. Curt Roach AT [facility] ENDOCRINOLOGY  . Genital warts   . History of Papanicolaou smear of cervix 02/21/2012L 06/22/2015   -/-; -/-  . Hyperlipidemia   . Hypertension 1998   Currently on lisinopril - well controlled  . Leiomyoma of uterus 2014, 2017   ESSENTIALLY 3X3, 3X3  . Stress incontinence 2000    Past Surgical History:  Past Surgical History:  Procedure Laterality  Date  . INCONTINENCE SURGERY  03/10/13   Dr. Star Roach  . INTRAUTERINE DEVICE (IUD) INSERTION  06/08/2010  . TONSILLECTOMY  02/2013   Dr. Tami Roach    Gynecologic History:  No LMP recorded. (Menstrual status: IUD). Contraception: IUD 06/08/2010 Last Pap: Results were: 06/22/2015 NIL and HR HPV negative   Obstetric History: [id number]  Family History:  Family History  Problem Relation Roach of Onset  . Heart disease Father 49       CAD s/p quadruple bypass  . Diabetes Father        TYPE 2  . Arthritis Brother   . Hyperlipidemia Brother   . Hypertension Brother   . Diabetes Maternal Grandmother   . Cancer Maternal Grandmother 60       COLON  . Cancer Paternal Grandmother 53       breast cancer  . Heart disease Paternal Grandmother   . Diabetes Paternal Grandmother   . Cancer Paternal Grandfather 34       prostate cancer    Social History:  Social History   Socioeconomic History  . Marital status: Married    Spouse name: Linda Roach  . Number of children: 1  . Years of education: 85  . Highest education level: Not on file  Occupational History  . Occupation: Buyer, retail: [employer]    Comment: [place]  . Financial resource strain: Not on file  . Food insecurity:    Worry: Not on file    Inability: Not on file  . Transportation needs:  Medical: Not on file    Non-medical: Not on file  Tobacco Use  . Smoking status: Never Smoker  . Smokeless tobacco: Never Used  Substance and Sexual Activity  . Alcohol use: Yes    Comment: Has a couple of drinks monthly  . Drug use: No  . Sexual activity: Yes    Partners: Male    Birth control/protection: IUD  Lifestyle  . Physical activity:    Days per week: Not on file    Minutes per session: Not on file  . Stress: Not on file  Relationships  . Social connections:    Talks on phone: Not on file    Gets together: Not on file    Attends religious service: Not on file    Active member of club or  organization: Not on file    Attends meetings of clubs or organizations: Not on file    Relationship status: Not on file  . Intimate partner violence:    Fear of current or ex partner: Not on file    Emotionally abused: Not on file    Physically abused: Not on file    Forced sexual activity: Not on file  Other Topics Concern  . Not on file  Social History Narrative   Linda Roach grew up [employer]. She is married to her husband of 22 years. They have a daughter. They have 3 dogs and 2 cats. They are currently living in [facility]. 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 She enjoys being outdoors. She tries to walk daily whenever possible. She loves the outdoors. Loves to [employer] and garden.    Allergies:  Allergies  Allergen Reactions  . Kiwi Extract     Other reaction(s): Other (See Comments) Mouth blister.    Medications: Prior to Admission medications   Medication Sig Start Date End Date Taking? Authorizing Provider  acetaminophen (TYLENOL) 500 MG tablet Take 500 mg by mouth daily as needed for pain.   Yes [provider]  insulin glargine (LANTUS) 100 UNIT/ML injection Inject 25 Units into the skin at bedtime.   Yes [provider]  insulin lispro (HUMALOG) 100 UNIT/ML injection As needed for elevated blood sugar 07/03/13  Yes Linda Roach, Linda M, NP  levonorgestrel (MIRENA) 20 MCG/24HR IUD 1 each by Intrauterine route once.   Yes [provider]  lisinopril (PRINIVIL,ZESTRIL) 20 MG tablet TAKE 1 TABLET BY MOUTH EVERY DAY. 03/22/14  Yes Linda Roach, Linda Heckler, RN  metFORMIN (GLUCOPHAGE) 1000 MG tablet Take 1 tablet (1,000 mg total) by mouth 2 (two) times daily with a meal. 08/04/14  Yes Linda Roach, Linda Heckler, RN  valACYclovir (VALTREX) 500 MG tablet TAKE 1 TABLET(500 MG) BY MOUTH DAILY 08/07/17  Yes Malachy Mood, MD    Physical Exam Vitals: Blood pressure 140/82, pulse 94, height 5\' 5"  (1.651 Roach), weight 179 lb (81.2 kg).  General: NAD HEENT:  normocephalic, anicteric Thyroid: no enlargement, no palpable nodules Pulmonary: No increased work of breathing, CTAB Cardiovascular: RRR, distal pulses 2+ Breast: Breast symmetrical, no tenderness, no palpable nodules or masses, no skin or nipple retraction present, no nipple discharge.  No axillary or supraclavicular lymphadenopathy. Abdomen: NABS, soft, non-tender, non-distended.  Umbilicus without lesions.  No hepatomegaly, splenomegaly or masses palpable. No evidence of hernia  Genitourinary:  External: Normal external female genitalia.  Normal urethral meatus, normal Bartholin's and Skene's glands.    Vagina: Normal vaginal mucosa, no evidence of prolapse.    Cervix: Grossly normal in  appearance, no bleeding, IUD strings visualized relatively flush with cervix  Uterus: Non-enlarged, mobile, normal contour.  No CMT  Adnexa: ovaries non-enlarged, no adnexal masses  Rectal: deferred  Lymphatic: no evidence of inguinal lymphadenopathy Extremities: no edema, erythema, or tenderness Neurologic: Grossly intact Psychiatric: Roach appropriate, affect full  Female chaperone present for pelvic and breast  portions of the physical exam    Assessment: 47 y.o. [id number] routine annual exam  Plan: Problem List Items Addressed This Visit    None    Visit Diagnoses    Flu vaccine need    -  Primary   Relevant Orders   Flu Vaccine QUAD 36+ mos IM (Completed)   Painful urination       Relevant Orders   POCT urinalysis dipstick (Completed)   Urine Culture   Encounter for gynecological examination without abnormal finding       Relevant Orders   Cytology - PAP   Screening for malignant neoplasm of cervix       Relevant Orders   Cytology - PAP   Breast screening       Relevant Orders   MM 3D SCREEN BREAST BILATERAL      1) Mammogram - recommend yearly screening mammogram.  Mammogram Was ordered today   2) STI screening  was notoffered and therefore not obtained  3) ASCCP guidelines  and rational discussed.  Patient opts for every 3 years screening interval  4) Contraception - the patient is currently using  IUD.  She is happy with her current form of contraception and plans to continue - IUD at end of lifespan plan on exchange in the next week  5) Colonoscopy -- starting at Roach 26  6) Routine healthcare maintenance including cholesterol, diabetes screening discussed managed by PCP  7) UTI - Rx Macrobid, urine culture sent.  8)  Return in about 1 week (around 01/29/2018) for IUD removal and reinsertion (Mirena).   Malachy Mood, MD, [facility], [facility] 01/22/2018, 3:44 PM

## 2018-01-22 NOTE — Patient Instructions (Signed)
[facility] [address] [place] [address]  [facility] [place]  [address] [place] [address]  Phone: [phone]  

## 2018-01-22 NOTE — Telephone Encounter (Signed)
11/11 at 850 with AMS for mirena

## 2018-01-24 LAB — URINE CULTURE

## 2018-01-24 LAB — CYTOLOGY - PAP
ADEQUACY: ABSENT
DIAGNOSIS: NEGATIVE
HPV (WINDOPATH): NOT DETECTED

## 2018-02-03 ENCOUNTER — Ambulatory Visit (INDEPENDENT_AMBULATORY_CARE_PROVIDER_SITE_OTHER): Payer: Federal, State, Local not specified - PPO | Admitting: Obstetrics and Gynecology

## 2018-02-03 ENCOUNTER — Encounter: Payer: Self-pay | Admitting: Obstetrics and Gynecology

## 2018-02-03 VITALS — BP 130/90 | HR 84 | Wt 183.0 lb

## 2018-02-03 DIAGNOSIS — Z30430 Encounter for insertion of intrauterine contraceptive device: Secondary | ICD-10-CM

## 2018-02-03 DIAGNOSIS — Z30433 Encounter for removal and reinsertion of intrauterine contraceptive device: Secondary | ICD-10-CM

## 2018-02-03 DIAGNOSIS — Z30432 Encounter for removal of intrauterine contraceptive device: Secondary | ICD-10-CM

## 2018-02-03 NOTE — Progress Notes (Signed)
    GYNECOLOGY OFFICE PROCEDURE NOTE  MARQUESA RATH is a 47 y.o. [phone] here for IUD removal and reinsertion. The patient currently has a  Mirena IUD placed on 7 year ago, which will be replaced with a Mirena  IUD today.  No GYN concerns.  Last pap smear was on 06/20/15 and was normal.  IUD Removal and Reinsertion  Patient identified, informed consent performed, consent signed.   Discussed risks of irregular bleeding, cramping, infection, malpositioning or uterine perforation of the IUD which may require further procedures. Time out was performed. Speculum placed in the vagina. The strings of the IUD were grasped and pulled using ring forceps. The IUD was successfully removed in its entirety. The cervix was cleaned with Betadine x 2 and grasped anteriorly with a single tooth tenaculum.  The uterus was sounded to IUD insertion apparatus was used to sound the uterus to 8 cm using a uterine sound.  The IUD was then placed per manufacturer's recommendations. Strings trimmed to 3 cm. Tenaculum was removed, good hemostasis noted. Patient tolerated procedure well.   Patient was given post-procedure instructions.  Patient was also asked to check IUD strings periodically and follow up in 6 weeks for IUD check.   Malachy Mood, MD, [facility], [place]

## 2018-02-10 ENCOUNTER — Telehealth: Payer: Self-pay | Admitting: Obstetrics and Gynecology

## 2018-02-10 ENCOUNTER — Other Ambulatory Visit: Payer: Self-pay | Admitting: Obstetrics and Gynecology

## 2018-02-10 MED ORDER — LISINOPRIL 20 MG PO TABS: ORAL_TABLET | ORAL | 3 refills | Status: DC

## 2018-02-10 MED ORDER — PHENTERMINE HCL 37.5 MG PO TABS: 37.5000 mg | ORAL_TABLET | Freq: Every day | ORAL | 0 refills | Status: DC

## 2018-02-10 NOTE — Telephone Encounter (Signed)
-----   Message from Malachy Mood, MD sent at 02/10/2018  8:17 AM EST ----- Regarding: BP check  BP check in 1 week

## 2018-02-10 NOTE — Telephone Encounter (Signed)
Patient is schedule 02/18/18 with AMS

## 2018-02-18 ENCOUNTER — Ambulatory Visit (INDEPENDENT_AMBULATORY_CARE_PROVIDER_SITE_OTHER): Payer: Federal, State, Local not specified - PPO | Admitting: Obstetrics and Gynecology

## 2018-02-18 ENCOUNTER — Encounter: Payer: Self-pay | Admitting: Obstetrics and Gynecology

## 2018-02-18 VITALS — BP 122/76 | Wt 180.0 lb

## 2018-02-18 DIAGNOSIS — Z0130 Encounter for examination of blood pressure without abnormal findings: Secondary | ICD-10-CM | POA: Diagnosis not present

## 2018-02-18 MED ORDER — LISINOPRIL 20 MG PO TABS: ORAL_TABLET | ORAL | 3 refills | Status: DC

## 2018-02-18 NOTE — Progress Notes (Signed)
Obstetrics & Gynecology Office Visit   Chief Complaint:  Chief Complaint  Patient presents with  . Follow-up    Blood pressure check    History of Present Illness: 47 y.o. Linda Roach being seen for follow up blood pressure check today.  The patient is not pregnantThe established diagnosis for the patient is chronic hypertension.  She is currently on lisinopril 20mg  daily.  She reports no current symptoms attributable to her blood pressure.  Medication list reviewed medications contraindicated for use in patient with current hypertension were noted.  BP remains normotensive on lisinopril despite starting phentermine course.  Review of Systems: Review of Systems  Constitutional: Negative.   Cardiovascular: Negative.   Neurological: Negative for headaches.     Past Medical History:  Past Medical History:  Diagnosis Date  . Diabetes mellitus without complication ([place]) 4540   Type 1 - DR. Curt Jews AT [facility] ENDOCRINOLOGY  . Genital warts   . History of Papanicolaou smear of cervix 02/21/2012L 06/22/2015   -/-; -/-  . Hyperlipidemia   . Hypertension 1998   Currently on lisinopril - well controlled  . Leiomyoma of uterus 2014, 2017   ESSENTIALLY 3X3, 3X3  . Stress incontinence 2000    Past Surgical History:  Past Surgical History:  Procedure Laterality Date  . INCONTINENCE SURGERY  03/10/13   Dr. Star Age  . INTRAUTERINE DEVICE (IUD) INSERTION  06/08/2010  . TONSILLECTOMY  02/2013   Dr. Tami Ribas    Gynecologic History: No LMP recorded. (Menstrual status: IUD).  Obstetric History: [id number]  Family History:  Family History  Problem Relation Age of Onset  . Heart disease Father 63       CAD s/p quadruple bypass  . Diabetes Father        TYPE 2  . Arthritis Brother   . Hyperlipidemia Brother   . Hypertension Brother   . Diabetes Maternal Grandmother   . Cancer Maternal Grandmother 60       COLON  . Cancer Paternal Grandmother 63       breast cancer  . Heart  disease Paternal Grandmother   . Diabetes Paternal Grandmother   . Cancer Paternal Grandfather 54       prostate cancer    Social History:  Social History   Socioeconomic History  . Marital status: Married    Spouse name: Araceli Bouche  . Number of children: 1  . Years of education: 21  . Highest education level: Not on file  Occupational History  . 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 Financial resource strain: Not on file  . Food insecurity:    Worry: Not on file    Inability: Not on file  . Transportation needs:    Medical: Not on file    Non-medical: Not on file  Tobacco Use  . Smoking status: Never Smoker  . Smokeless tobacco: Never Used  Substance and Sexual Activity  . Alcohol use: Yes    Comment: Has a couple of drinks monthly  . Drug use: No  . Sexual activity: Yes    Partners: Male    Birth control/protection: IUD  Lifestyle  . Physical activity:    Days per week: Not on file    Minutes per session: Not on file  . Stress: Not on file  Relationships  . Social connections:    Talks on phone: Not on file    Gets together: Not  on file    Attends religious service: Not on file    Active member of club or organization: Not on file    Attends meetings of clubs or organizations: Not on file    Relationship status: Not on file  . Intimate partner violence:    Fear of current or ex partner: Not on file    Emotionally abused: Not on file    Physically abused: Not on file    Forced sexual activity: Not on file  Other Topics Concern  . Not on file  Social History Narrative   Brindle grew up [employer]. She is married to her husband of 22 years. They have a daughter. They have 3 dogs and 2 cats. They are currently living in [facility]. 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 She enjoys being outdoors. She tries to walk daily whenever possible. She loves the outdoors. Loves to [employer] and garden.      Allergies:  Allergies  Allergen Reactions  . Kiwi Extract     Other reaction(s): Other (See Comments) Mouth blister.    Medications: Prior to Admission medications   Medication Sig Start Date End Date Taking? Authorizing Provider  acetaminophen (TYLENOL) 500 MG tablet Take 500 mg by mouth daily as needed for pain.    [provider]  insulin glargine (LANTUS) 100 UNIT/ML injection Inject 25 Units into the skin at bedtime.    [provider]  insulin lispro (HUMALOG) 100 UNIT/ML injection As needed for elevated blood sugar 07/03/13   Rey, Raquel M, NP  levonorgestrel (MIRENA) 20 MCG/24HR IUD 1 each by Intrauterine route once.    [provider]  lisinopril (PRINIVIL,ZESTRIL) 20 MG tablet TAKE 1 TABLET BY MOUTH EVERY DAY. 02/10/18   Malachy Mood, MD  metFORMIN (GLUCOPHAGE) 1000 MG tablet Take 1 tablet (1,000 mg total) by mouth 2 (two) times daily with a meal. Patient not taking: Reported on 02/03/2018 08/04/14   Rubbie Battiest, RN  phentermine (ADIPEX-P) 37.5 MG tablet Take 1 tablet (37.5 mg total) by mouth daily before breakfast. 02/10/18   Malachy Mood, MD  valACYclovir (VALTREX) 500 MG tablet TAKE 1 TABLET(500 MG) BY MOUTH DAILY 08/07/17   Malachy Mood, MD    Physical Exam Blood pressure 122/76, weight 180 lb (81.6 kg). Filed Weights   02/18/18 1518  Weight: 180 lb (81.6 kg)     No LMP recorded. (Menstrual status: IUD).  General: NAD HEENT: normocephalic, anicteric Pulmonary: No increased work of breathing Neurologic: Grossly intact Psychiatric: mood appropriate, affect full  Assessment: 47 y.o. [id number] presenting for blood pressure evaluation today  Plan: Problem List Items Addressed This Visit    None    Visit Diagnoses    BP check    -  Primary      1) Blood pressure - blood pressure at today's visit is normotensive.  As a result continue present dose of lisinopril. - additional blood work was not obtained   Malachy Mood, MD, [place], [facility] 02/18/2018, 3:36 PM

## 2018-03-11 ENCOUNTER — Ambulatory Visit: Payer: Federal, State, Local not specified - PPO | Admitting: Obstetrics and Gynecology

## 2018-03-20 ENCOUNTER — Ambulatory Visit: Payer: Federal, State, Local not specified - PPO | Admitting: Obstetrics and Gynecology

## 2018-03-20 ENCOUNTER — Encounter: Payer: Self-pay | Admitting: Obstetrics and Gynecology

## 2018-03-20 VITALS — BP 133/77 | HR 93 | Ht 65.0 in | Wt 173.0 lb

## 2018-03-20 DIAGNOSIS — Z30431 Encounter for routine checking of intrauterine contraceptive device: Secondary | ICD-10-CM

## 2018-03-21 NOTE — Progress Notes (Signed)
Obstetrics & Gynecology Office Visit   Chief Complaint:  Chief Complaint  Patient presents with  . Contraception    iud string check    History of Present Illness: 47 y.o. patient presenting for follow up of Mirena IUD placement 6+ weeks ago.  The indication for her IUD was cycle control.  She denies any complications since her IUD placement.  Still having some occasional spotting.  is able to feel strings.    Review of Systems: Review of Systems  Constitutional: Negative.   Gastrointestinal: Negative.   Genitourinary: Negative.     Past Medical History:  Past Medical History:  Diagnosis Date  . Diabetes mellitus without complication ([place]) 6468   Type 1 - DR. Curt Jews AT [facility] ENDOCRINOLOGY  . Genital warts   . History of Papanicolaou smear of cervix 02/21/2012L 06/22/2015   -/-; -/-  . Hyperlipidemia   . Hypertension 1998   Currently on lisinopril - well controlled  . Leiomyoma of uterus 2014, 2017   ESSENTIALLY 3X3, 3X3  . Stress incontinence 2000    Past Surgical History:  Past Surgical History:  Procedure Laterality Date  . INCONTINENCE SURGERY  03/10/13   Dr. Star Age  . INTRAUTERINE DEVICE (IUD) INSERTION  06/08/2010  . TONSILLECTOMY  02/2013   Dr. Tami Ribas    Gynecologic History: No LMP recorded. (Menstrual status: IUD).  Obstetric History: [id number]  Family History:  Family History  Problem Relation Age of Onset  . Heart disease Father 21       CAD s/p quadruple bypass  . Diabetes Father        TYPE 2  . Arthritis Brother   . Hyperlipidemia Brother   . Hypertension Brother   . Diabetes Maternal Grandmother   . Cancer Maternal Grandmother 60       COLON  . Cancer Paternal Grandmother 51       breast cancer  . Heart disease Paternal Grandmother   . Diabetes Paternal Grandmother   . Cancer Paternal Grandfather 33       prostate cancer    Social History:  Social History   Socioeconomic History  . Marital status: Married    Spouse  name: Araceli Bouche  . Number of children: 1  . Years of education: 62  . Highest education level: Not on file  Occupational History  . Occupation: Buyer, retail: [employer]    Comment: [place]  . Financial resource strain: Not on file  . Food insecurity:    Worry: Not on file    Inability: Not on file  . Transportation needs:    Medical: Not on file    Non-medical: Not on file  Tobacco Use  . Smoking status: Never Smoker  . Smokeless tobacco: Never Used  Substance and Sexual Activity  . Alcohol use: Yes    Comment: Has a couple of drinks monthly  . Drug use: No  . Sexual activity: Yes    Partners: Male    Birth control/protection: I.U.D.  Lifestyle  . Physical activity:    Days per week: Not on file    Minutes per session: Not on file  . Stress: Not on file  Relationships  . Social connections:    Talks on phone: Not on file    Gets together: Not on file    Attends religious service: Not on file    Active member of club or organization: Not on file  Attends meetings of clubs or organizations: Not on file    Relationship status: Not on file  . Intimate partner violence:    Fear of current or ex partner: Not on file    Emotionally abused: Not on file    Physically abused: Not on file    Forced sexual activity: Not on file  Other Topics Concern  . Not on file  Social History Narrative   Jeniece grew up [employer]. She is married to her husband of 22 years. They have a daughter. They have 3 dogs and 2 cats. They are currently living in [facility]. 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 She enjoys being outdoors. She tries to walk daily whenever possible. She loves the outdoors. Loves to [employer] and garden.    Allergies:  Allergies  Allergen Reactions  . Kiwi Extract     Other reaction(s): Other (See Comments) Mouth blister.    Medications: Prior to Admission medications   Medication Sig Start Date End Date Taking?  Authorizing Provider  acetaminophen (TYLENOL) 500 MG tablet Take 500 mg by mouth daily as needed for pain.   Yes [provider]  insulin glargine (LANTUS) 100 UNIT/ML injection Inject 25 Units into the skin at bedtime.   Yes [provider]  insulin lispro (HUMALOG) 100 UNIT/ML injection As needed for elevated blood sugar 07/03/13  Yes Rey, Raquel M, NP  levonorgestrel (MIRENA) 20 MCG/24HR IUD 1 each by Intrauterine route once.   Yes [provider]  lisinopril (PRINIVIL,ZESTRIL) 20 MG tablet TAKE 1 TABLET BY MOUTH EVERY DAY. 02/18/18  Yes Malachy Mood, MD  metFORMIN (GLUCOPHAGE) 1000 MG tablet Take 1 tablet (1,000 mg total) by mouth 2 (two) times daily with a meal. 08/04/14  Yes Doss, Velora Heckler, RN  phentermine (ADIPEX-P) 37.5 MG tablet Take 1 tablet (37.5 mg total) by mouth daily before breakfast. 02/10/18  Yes Malachy Mood, MD  valACYclovir (VALTREX) 500 MG tablet TAKE 1 TABLET(500 MG) BY MOUTH DAILY 08/07/17  Yes Malachy Mood, MD    Physical Exam Blood pressure 133/77, pulse 93, height 5\' 5"  (1.651 m), weight 173 lb (78.5 kg). No LMP recorded. (Menstrual status: IUD).  General: NAD HEENT: normocephalic, anicteric Pulmonary: No increased work of breathing  Genitourinary:  External: Normal external female genitalia.  Normal urethral meatus, normal  Bartholin's and Skene's glands.    Vagina: Normal vaginal mucosa, no evidence of prolapse.    Cervix: Grossly normal in appearance, no bleeding, IUD strings visualized 2cm  Uterus: Non-enlarged, mobile, normal contour.  No CMT  Adnexa: ovaries non-enlarged, no adnexal masses  Rectal: deferred  Lymphatic: no evidence of inguinal lymphadenopathy Extremities: no edema, erythema, or tenderness Neurologic: Grossly intact Psychiatric: mood appropriate, affect full  Female chaperone present for pelvic and breast  portions of the physical exam  Assessment: 47 y.o. [id number] IUD string check Plan: Problem  List Items Addressed This Visit    None    Visit Diagnoses    IUD check up    -  Primary       1.  The patient was given instructions to check her IUD strings monthly and call with any problems or concerns.  She should call for fevers, chills, abnormal vaginal discharge, pelvic pain, or other complaints.  2.   IUDs while effective at preventing pregnancy do not prevent transmission of sexually transmitted diseases and use of barrier methods for this purpose was discussed.  Low overall incidence of failure with  99.7% efficacy rate in typical use.  The patient has not contraindication to IUD placement.  3.  She will return for a annual exam in 1 year.  All questions answered.  4) A total of 15 minutes were spent in face-to-face contact with the patient during this encounter with over half of that time devoted to counseling and coordination of care.  5) Return in about 1 year (around 03/21/2019) for annual.   Malachy Mood, MD, [place], [place]

## 2018-03-27 ENCOUNTER — Other Ambulatory Visit: Payer: Self-pay | Admitting: Obstetrics and Gynecology

## 2018-03-27 ENCOUNTER — Other Ambulatory Visit: Payer: Self-pay

## 2018-04-01 ENCOUNTER — Other Ambulatory Visit: Payer: Self-pay | Admitting: Obstetrics and Gynecology

## 2018-04-01 MED ORDER — PHENTERMINE HCL 37.5 MG PO TABS: ORAL_TABLET | ORAL | 0 refills | Status: DC

## 2018-04-21 ENCOUNTER — Other Ambulatory Visit: Payer: Self-pay | Admitting: Obstetrics and Gynecology

## 2018-04-21 ENCOUNTER — Encounter: Payer: Self-pay | Admitting: Physician Assistant

## 2018-04-21 ENCOUNTER — Telehealth: Payer: Federal, State, Local not specified - PPO | Admitting: Physician Assistant

## 2018-04-21 DIAGNOSIS — N390 Urinary tract infection, site not specified: Secondary | ICD-10-CM | POA: Diagnosis not present

## 2018-04-21 MED ORDER — NITROFURANTOIN MONOHYD MACRO 100 MG PO CAPS: 100.0000 mg | ORAL_CAPSULE | Freq: Two times a day (BID) | ORAL | 0 refills | Status: DC

## 2018-04-21 NOTE — Progress Notes (Addendum)
We are sorry that you are not feeling well.  Here is how we plan to help!  Based on what you shared with me it looks like you most likely have a simple urinary tract infection.  A UTI (Urinary Tract Infection) is a bacterial infection of the bladder.  Most cases of urinary tract infections are simple to treat but a key part of your care is to encourage you to drink plenty of fluids and watch your symptoms carefully.  I have prescribed MacroBid 100 mg twice a day for 5 days. UTI with back pain can indicate that the infection is going up to the kidneys. If back /abdominal pain continues, worsens, or if you develop any new symptoms, go to an Urgent Care for the ER for evaluation.  Your symptoms should gradually improve. Call us if the burning in your urine worsens, you develop worsening fever, back pain or pelvic pain or if your symptoms do not resolve after completing the antibiotic.  Urinary tract infections can be prevented by drinking plenty of water to keep your body hydrated.  Also be sure when you wipe, wipe from front to back and don't hold it in!  If possible, empty your bladder every 4 hours.  Your e-visit answers were reviewed by a board certified advanced clinical practitioner to complete your personal care plan.  Depending on the condition, your plan could have included both over the counter or prescription medications.  If there is a problem please reply  once you have received a response from your provider.  Your safety is important to [place].  If you have drug allergies check your prescription carefully.    You can use MyChart to ask questions about today's visit, request a non-urgent call back, or ask for a work or school excuse for 24 hours related to this e-Visit. If it has been greater than 24 hours you will need to follow up with your provider, or enter a new e-Visit to address those concerns.   You will get an e-mail in the next two days asking about your experience.  I hope that  your e-visit has been valuable and will speed your recovery. Thank you for using e-visits.  I have spent 7 minutes in review of this chart- L[facility]

## 2018-04-21 NOTE — Telephone Encounter (Signed)
Please advise 

## 2018-06-21 ENCOUNTER — Other Ambulatory Visit: Payer: Self-pay | Admitting: Obstetrics and Gynecology

## 2018-10-13 ENCOUNTER — Other Ambulatory Visit: Payer: Self-pay

## 2018-10-13 ENCOUNTER — Telehealth: Payer: Self-pay

## 2018-10-13 ENCOUNTER — Other Ambulatory Visit: Payer: Self-pay | Admitting: Obstetrics and Gynecology

## 2018-10-13 DIAGNOSIS — Z20822 Contact with and (suspected) exposure to covid-19: Secondary | ICD-10-CM

## 2018-10-13 NOTE — Telephone Encounter (Signed)
Pt aware.

## 2018-10-13 NOTE — Telephone Encounter (Signed)
I just placed an order for the lab but I'm not sure if there is a number she needs to call or just go to the drive through testing site

## 2018-10-13 NOTE — Telephone Encounter (Signed)
Pt states she does not have a PCP. She needs a DR to put in a order for [employer], Her son had tested Positive for the virus and The health Dept told her she needed a Dr. to order the test, due to her Job she needs to be tested or she can not return to work. Please advise

## 2018-10-17 LAB — NOVEL CORONAVIRUS, NAA: SARS-CoV-2, NAA: NOT DETECTED

## 2019-02-07 ENCOUNTER — Other Ambulatory Visit: Payer: Self-pay | Admitting: Obstetrics and Gynecology

## 2019-02-11 ENCOUNTER — Other Ambulatory Visit: Payer: Self-pay | Admitting: Obstetrics and Gynecology

## 2019-02-11 MED ORDER — NITROFURANTOIN MONOHYD MACRO 100 MG PO CAPS: 100.0000 mg | ORAL_CAPSULE | Freq: Two times a day (BID) | ORAL | 0 refills | Status: DC

## 2019-02-25 ENCOUNTER — Other Ambulatory Visit: Payer: Self-pay | Admitting: Obstetrics and Gynecology

## 2019-02-26 NOTE — Telephone Encounter (Signed)
Please advise 

## 2019-05-04 ENCOUNTER — Ambulatory Visit: Payer: Federal, State, Local not specified - PPO | Attending: Internal Medicine

## 2019-05-04 DIAGNOSIS — Z20822 Contact with and (suspected) exposure to covid-19: Secondary | ICD-10-CM

## 2019-05-05 LAB — NOVEL CORONAVIRUS, NAA: SARS-CoV-2, NAA: NOT DETECTED

## 2019-05-05 LAB — SPECIMEN STATUS REPORT

## 2019-05-14 ENCOUNTER — Other Ambulatory Visit: Payer: Self-pay

## 2019-05-14 ENCOUNTER — Ambulatory Visit (INDEPENDENT_AMBULATORY_CARE_PROVIDER_SITE_OTHER): Payer: Federal, State, Local not specified - PPO | Admitting: Obstetrics and Gynecology

## 2019-05-14 ENCOUNTER — Encounter: Payer: Self-pay | Admitting: Obstetrics and Gynecology

## 2019-05-14 VITALS — BP 145/86 | HR 85 | Ht 65.0 in | Wt 198.0 lb

## 2019-05-14 DIAGNOSIS — Z01411 Encounter for gynecological examination (general) (routine) with abnormal findings: Secondary | ICD-10-CM | POA: Diagnosis not present

## 2019-05-14 DIAGNOSIS — N644 Mastodynia: Secondary | ICD-10-CM

## 2019-05-14 DIAGNOSIS — Z23 Encounter for immunization: Secondary | ICD-10-CM

## 2019-05-14 DIAGNOSIS — Z01419 Encounter for gynecological examination (general) (routine) without abnormal findings: Secondary | ICD-10-CM

## 2019-05-14 DIAGNOSIS — Z1239 Encounter for other screening for malignant neoplasm of breast: Secondary | ICD-10-CM

## 2019-05-14 NOTE — Progress Notes (Signed)
Gynecology Annual Exam  PCP: Sherryl Barters, NP  Chief Complaint:  Chief Complaint  Patient presents with  . Gynecologic Exam    tenderness right breast    History of Present Illness: Patient is a 49 y.o. [id number] presents for annual exam. The patient has no complaints today.   LMP: No LMP recorded. (Menstrual status: IUD). Amenorrhea on Mirena IUD  The patient is sexually active. She currently uses IUD for contraception. She denies dyspareunia.  The patient does perform self breast exams.  There is no notable family history of breast or ovarian cancer in her family (remote paternal history grandmother in 47).  The patient wears seatbelts: yes.   The patient has regular exercise: not asked.    The patient denies current symptoms of depression.    Has noted right breast pain.  Worse with use of the right arm like holding her granddaughter.  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  Is unable to recall trauma or inciting event.  Has been present for 3 weeks.  Slightly improved, currently rated 5/10.  Review of Systems: Review of Systems  Constitutional: Negative for chills and fever.  HENT: Negative for congestion.   Respiratory: Negative for cough and shortness of breath.   Cardiovascular: Negative for chest pain and palpitations.  Gastrointestinal: Negative for abdominal pain, constipation, diarrhea, heartburn, nausea and vomiting.  Genitourinary: Negative for dysuria, frequency and urgency.  Skin: Negative for itching and rash.  Neurological: Negative for dizziness and headaches.  Endo/Heme/Allergies: Negative for polydipsia.  Psychiatric/Behavioral: Negative for depression.    Past Medical History:  Past Medical History:  Diagnosis Date  . Diabetes mellitus without complication ([place]) [id number]   Type 1 - DR. Curt Jews AT [facility] ENDOCRINOLOGY  . Genital warts   . History of Papanicolaou smear of cervix 02/21/2012L 06/22/2015   -/-; -/-  . Hyperlipidemia   .  Hypertension 1998   Currently on lisinopril - well controlled  . Leiomyoma of uterus 2014, 2017   ESSENTIALLY 3X3, 3X3  . Stress incontinence 2000    Past Surgical History:  Past Surgical History:  Procedure Laterality Date  . INCONTINENCE SURGERY  03/10/13   Dr. Star Age  . INTRAUTERINE DEVICE (IUD) INSERTION  06/08/2010  . TONSILLECTOMY  02/2013   Dr. Tami Ribas    Gynecologic History:  No LMP recorded. (Menstrual status: IUD). Contraception: 02/03/2018 Mirena  IUD Last Pap: Results were: 01/22/2018 NIL and HR HPV negative   Obstetric History: [id number]  Family History:  Family History  Problem Relation Age of Onset  . Heart disease Father 49       CAD s/p quadruple bypass  . Diabetes Father        TYPE 2  . Arthritis Brother   . Hyperlipidemia Brother   . Hypertension Brother   . Diabetes Maternal Grandmother   . Cancer Maternal Grandmother 60       COLON  . Cancer Paternal Grandmother 21       breast cancer  . Heart disease Paternal Grandmother   . Diabetes Paternal Grandmother   . Cancer Paternal Grandfather 81       prostate cancer    Social History:  Social History   Socioeconomic History  . Marital status: Married    Spouse name: Araceli Bouche  . Number of children: 1  . Years of education: 58  . Highest education level: Not on file  Occupational History  . 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 Smoking status: Never Smoker  . Smokeless tobacco: Never Used  Substance and Sexual Activity  . Alcohol use: Yes    Comment: Has a couple of drinks monthly  . Drug use: No  . Sexual activity: Yes    Partners: Male    Birth control/protection: I.U.D.  Other Topics Concern  . Not on file  Social History Narrative   Radha grew up [employer]. She is married to her husband of 22 years. They have a daughter. They have 3 dogs and 2 cats. They are currently living in [facility]. 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 She enjoys being outdoors. She tries to walk daily whenever possible. She loves the outdoors. Loves to [employer] and garden.   Social Determinants of Health   Financial Resource Strain:   . Difficulty of Paying Living Expenses: Not on file  Food Insecurity:   . Worried About Charity fundraiser in the Last Year: Not on file  . Ran Out of Food in the Last Year: Not on file  Transportation Needs:   . Lack of Transportation (Medical): Not on file  . Lack of Transportation (Non-Medical): Not on file  Physical Activity:   . Days of Exercise per Week: Not on file  . Minutes of Exercise per Session: Not on file  Stress:   . Feeling of Stress : Not on file  Social Connections:   . Frequency of Communication with Friends and Family: Not on file  . Frequency of Social Gatherings with Friends and Family: Not on file  . Attends Religious Services: Not on file  . Active Member of Clubs or Organizations: Not on file  . Attends Archivist Meetings: Not on file  . Marital Status: Not on file  Intimate Partner Violence:   . Fear of Current or Ex-Partner: Not on file  . Emotionally Abused: Not on file  . Physically Abused: Not on file  . Sexually Abused: Not on file    Allergies:  Allergies  Allergen Reactions  . Kiwi Extract     Other reaction(s): Other (See Comments) Mouth blister.    Medications: Prior to Admission medications   Medication Sig Start Date End Date Taking? Authorizing Provider  acetaminophen (TYLENOL) 500 MG tablet Take 500 mg by mouth daily as needed for pain.    [provider]  insulin glargine (LANTUS) 100 UNIT/ML injection Inject 25 Units into the skin at bedtime.    [provider]  insulin lispro (HUMALOG) 100 UNIT/ML injection As needed for elevated blood sugar 07/03/13   Rey, Raquel M, NP  levonorgestrel (MIRENA) 20 MCG/24HR IUD 1 each by Intrauterine route once.    [provider]  lisinopril (ZESTRIL) 20  MG tablet TAKE 1 TABLET BY MOUTH EVERY DAY 02/26/19   Malachy Mood, MD  metFORMIN (GLUCOPHAGE) 1000 MG tablet Take 1 tablet (1,000 mg total) by mouth 2 (two) times daily with a meal. 08/04/14   Doss, Velora Heckler, RN  nitrofurantoin, macrocrystal-monohydrate, (MACROBID) 100 MG capsule Take 1 capsule (100 mg total) by mouth 2 (two) times daily. 02/11/19   Malachy Mood, MD  phentermine (ADIPEX-P) 37.5 MG tablet TAKE 1 TABLET(37.5 MG) BY MOUTH DAILY BEFORE BREAKFAST 04/01/18   Malachy Mood, MD  valACYclovir (VALTREX) 500 MG tablet TAKE 1 TABLET(500 MG) BY MOUTH DAILY 02/09/19   Malachy Mood, MD    Physical Exam Vitals: Blood pressure (!) 145/86,  pulse 85, height 5\' 5"  (1.651 m), weight 198 lb (89.8 kg).  General: NAD HEENT: normocephalic, anicteric Thyroid: no enlargement, no palpable nodules Pulmonary: No increased work of breathing, CTAB Cardiovascular: RRR, distal pulses 2+ Breast: Breast symmetrical, no tenderness, no palpable nodules or masses, no skin or nipple retraction present, no nipple discharge.  No axillary or supraclavicular lymphadenopathy. Abdomen: NABS, soft, non-tender, non-distended.  Umbilicus without lesions.  No hepatomegaly, splenomegaly or masses palpable. No evidence of hernia  Genitourinary:  External: Normal external female genitalia.  Normal urethral meatus, normal Bartholin's and Skene's glands.    Vagina: Normal vaginal mucosa, no evidence of prolapse.    Cervix: Grossly normal in appearance, no bleeding, IUD string visualized  Uterus: Non-enlarged, mobile, normal contour.  No CMT  Adnexa: ovaries non-enlarged, no adnexal masses  Rectal: deferred  Lymphatic: no evidence of inguinal lymphadenopathy Extremities: no edema, erythema, or tenderness Neurologic: Grossly intact Psychiatric: mood appropriate, affect full  Female chaperone present for pelvic and breast  portions of the physical exam    Assessment: 49 y.o. [id number] routine annual  exam  Plan: Problem List Items Addressed This Visit    None    Visit Diagnoses    Encounter for gynecological examination without abnormal finding    -  Primary   Breast screening       Relevant Orders   MM DIAG BREAST TOMO BILATERAL   US BREAST COMPLETE UNI LEFT INC AXILLA   US BREAST COMPLETE UNI RIGHT INC AXILLA   Flu vaccine need       Relevant Orders   Flu Vaccine QUAD 36+ mos IM (Completed)   Mastodynia of right breast       Relevant Orders   MM DIAG BREAST TOMO BILATERAL   US BREAST COMPLETE UNI LEFT INC AXILLA   US BREAST COMPLETE UNI RIGHT INC AXILLA      1) Mammogram - recommend yearly screening mammogram.  Mammogram Was ordered today   2) STI screening  was notoffered and therefore not obtained  3) ASCCP guidelines and rational discussed.  Patient opts for every 3 years screening interval  4) Contraception - the patient is currently using  IUD.  She is happy with her current form of contraception and plans to continue  5) Colonoscopy -- Screening recommended starting at age 62 for average risk individuals, age 52 for individuals deemed at increased risk (including African Americans) and recommended to continue until age 74.  For patient age 2-85 individualized approach is recommended.  Gold standard screening is via colonoscopy, Cologuard screening is an acceptable alternative for patient unwilling or unable to undergo colonoscopy.  "Colorectal cancer screening for average?risk adults: 2018 guideline update from the American Cancer Society"CA: A Cancer Journal for Clinicians: Aug 22, 2016   6) Routine healthcare maintenance including cholesterol, diabetes screening discussed managed by PCP  7) Return in about 3 weeks (around 06/04/2019) for Clinical breast exam.   Malachy Mood, MD, [place], [facility] 05/14/2019, 3:12 PM

## 2019-05-18 ENCOUNTER — Encounter: Payer: Self-pay | Admitting: Obstetrics and Gynecology

## 2019-05-20 ENCOUNTER — Telehealth: Payer: Self-pay | Admitting: Obstetrics and Gynecology

## 2019-05-20 NOTE — Telephone Encounter (Signed)
Patient aware of her appt time and date at [facility] on 05/29/19 @ 1:20pm

## 2019-05-29 ENCOUNTER — Ambulatory Visit
Admission: RE | Admit: 2019-05-29 | Discharge: 2019-05-29 | Disposition: A | Discharge status: Home / Self Care | Payer: Federal, State, Local not specified - PPO | Source: Ambulatory Visit | Attending: Obstetrics and Gynecology | Admitting: Obstetrics and Gynecology

## 2019-05-29 DIAGNOSIS — N644 Mastodynia: Secondary | ICD-10-CM | POA: Insufficient documentation

## 2019-05-29 DIAGNOSIS — Z1239 Encounter for other screening for malignant neoplasm of breast: Secondary | ICD-10-CM | POA: Diagnosis not present

## 2019-06-03 ENCOUNTER — Ambulatory Visit (INDEPENDENT_AMBULATORY_CARE_PROVIDER_SITE_OTHER): Payer: Federal, State, Local not specified - PPO | Admitting: Obstetrics and Gynecology

## 2019-06-03 ENCOUNTER — Other Ambulatory Visit: Payer: Self-pay

## 2019-06-03 ENCOUNTER — Encounter: Payer: Self-pay | Admitting: Obstetrics and Gynecology

## 2019-06-03 VITALS — BP 152/95 | Wt 192.0 lb

## 2019-06-03 DIAGNOSIS — N644 Mastodynia: Secondary | ICD-10-CM | POA: Diagnosis not present

## 2019-06-08 NOTE — Progress Notes (Signed)
Obstetrics & Gynecology Office Visit   Chief Complaint:  Chief Complaint  Patient presents with  . Follow-up    breast check, discuss mammogram    History of Present Illness: 49 y.o. [id number] presenting for follow up breast exam for mastodynia.  In the interim the patient has had a BI-RADI diagnostic mammogram.  Symptoms have been improving although she still reports some mild discomfort.  No new breast symptoms such as lesions, masses, or discharge reported.    Review of Systems: review of systems negative unless otherwise noted in HPI  Past Medical History:  Past Medical History:  Diagnosis Date  . Diabetes mellitus without complication ([place]) [id number]   Type 1 - DR. Curt Jews AT [facility] ENDOCRINOLOGY  . Family history of breast cancer    2/21 cancer genetic testing letter sent  . Genital warts   . History of Papanicolaou smear of cervix 02/21/2012L 06/22/2015   -/-; -/-  . Hyperlipidemia   . Hypertension 1998   Currently on lisinopril - well controlled  . Leiomyoma of uterus 2014, 2017   ESSENTIALLY 3X3, 3X3  . Stress incontinence 2000    Past Surgical History:  Past Surgical History:  Procedure Laterality Date  . INCONTINENCE SURGERY  03/10/13   Dr. Star Age  . INTRAUTERINE DEVICE (IUD) INSERTION  06/08/2010  . TONSILLECTOMY  02/2013   Dr. Tami Ribas    Gynecologic History: No LMP recorded. (Menstrual status: IUD).  Obstetric History: [id number]  Family History:  Family History  Problem Relation Age of Onset  . Heart disease Father 10       CAD s/p quadruple bypass  . Diabetes Father        TYPE 2  . Arthritis Brother   . Hyperlipidemia Brother   . Hypertension Brother   . Diabetes Maternal Grandmother   . Cancer Maternal Grandmother 60       COLON  . Heart disease Paternal Grandmother   . Diabetes Paternal Grandmother   . Breast cancer Paternal Grandmother 33  . Prostate cancer Paternal Grandfather 58    Social History:  Social History   Socioeconomic  History  . Marital status: Married    Spouse name: Araceli Bouche  . Number of children: 1  . Years of education: 53  . Highest education level: Not on file  Occupational History  . Occupation: Buyer, retail: [employer]    Comment: Scientist, research (medical)  Tobacco Use  . Smoking status: Never Smoker  . Smokeless tobacco: Never Used  Substance and Sexual Activity  . Alcohol use: Yes    Comment: Has a couple of drinks monthly  . Drug use: No  . Sexual activity: Yes    Partners: Male    Birth control/protection: I.U.D.  Other Topics Concern  . Not on file  Social History Narrative   [place] grew up [place]. She is married to her husband of 22 years. They have a daughter. They have 3 dogs and 2 cats. They are currently living in [facility]. 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 She enjoys being outdoors. She tries to walk daily whenever possible. She loves the outdoors. Loves to [employer] and garden.   Social Determinants of Health   Financial Resource Strain:   . Difficulty of Paying Living Expenses:   Food Insecurity:   . Worried About Charity fundraiser in the Last Year:   . [place] in the Last Year:  Transportation Needs:   . Film/video editor (Medical):   Marland Kitchen Lack of Transportation (Non-Medical):   Physical Activity:   . Days of Exercise per Week:   . Minutes of Exercise per Session:   Stress:   . Feeling of Stress :   Social Connections:   . Frequency of Communication with Friends and Family:   . Frequency of Social Gatherings with Friends and Family:   . Attends Religious Services:   . Active Member of Clubs or Organizations:   . Attends Archivist Meetings:   Marland Kitchen Marital Status:   Intimate Partner Violence:   . Fear of Current or Ex-Partner:   . Emotionally Abused:   Marland Kitchen Physically Abused:   . Sexually Abused:     Allergies:  Allergies  Allergen Reactions  . Kiwi Extract     Other reaction(s): Other (See Comments) Mouth  blister.    Medications: Prior to Admission medications   Medication Sig Start Date End Date Taking? Authorizing Provider  acetaminophen (TYLENOL) 500 MG tablet Take 500 mg by mouth daily as needed for pain.    [provider]  insulin glargine (LANTUS) 100 UNIT/ML injection Inject 25 Units into the skin at bedtime.    [provider]  insulin lispro (HUMALOG) 100 UNIT/ML injection As needed for elevated blood sugar 07/03/13   Rey, Raquel M, NP  levonorgestrel (MIRENA) 20 MCG/24HR IUD 1 each by Intrauterine route once.    [provider]  lisinopril (ZESTRIL) 20 MG tablet TAKE 1 TABLET BY MOUTH EVERY DAY 02/26/19   Malachy Mood, MD  metFORMIN (GLUCOPHAGE) 1000 MG tablet Take 1 tablet (1,000 mg total) by mouth 2 (two) times daily with a meal. 08/04/14   Doss, Velora Heckler, RN  valACYclovir (VALTREX) 500 MG tablet TAKE 1 TABLET(500 MG) BY MOUTH DAILY 02/09/19   Malachy Mood, MD    Physical Exam Vitals:  Vitals:   06/03/19 1626  BP: (!) 152/95   No LMP recorded. (Menstrual status: IUD).  General: NAD HEENT: normocephalic, anicteric Pulmonary: No increased work of breathing Breast symmetrical, no tenderness, no palpable nodules or masses, no skin or nipple retraction present, no nipple discharge.  No axillary or supraclavicular lymphadenopathy. Extremities: no edema, erythema, or tenderness Neurologic: Grossly intact Psychiatric: mood appropriate, affect full  Female chaperone present for breast exam  US BREAST LTD UNI RIGHT INC AXILLA  Result Date: 05/29/2019 CLINICAL DATA:  Patient presents for bilateral diagnostic exam due to focal breast pain and tenderness over the inner lower right breast 3 weeks. Patient states initially there was a palpable abnormality in this area which has resolved. Patient is due for her annual bilateral mammogram. EXAM: DIGITAL DIAGNOSTIC bilateral MAMMOGRAM WITH CAD AND TOMO ULTRASOUND right BREAST COMPARISON:  Previous  exam(s). ACR Breast Density Category b: There are scattered areas of fibroglandular density. FINDINGS: Exam demonstrates no focal abnormality over the inner lower right breast to account for patient's pain and palpable abnormality. Remainder of the right breast as well as the left breast is unchanged. Mammographic images were processed with CAD. Targeted ultrasound is performed, showing no focal abnormality over the inner lower right breast to account for patient's focal pain/tenderness or palpable abnormality. IMPRESSION: No focal abnormality over the inner lower right breast to account for patient's pain/tenderness and palpable abnormality. RECOMMENDATION: Recommend continued management of patient's right breast pain/tenderness and palpable abnormality on a clinical basis. Otherwise, recommend continued annual bilateral screening mammographic follow-up. I have discussed the findings and recommendations with the  patient. If applicable, a reminder letter will be sent to the patient regarding the next appointment. BI-RADS CATEGORY  1: Negative. Electronically Signed   By: Marin Olp M.D.   On: 05/29/2019 14:08   MM DIAG BREAST TOMO BILATERAL  Result Date: 05/29/2019 CLINICAL DATA:  Patient presents for bilateral diagnostic exam due to focal breast pain and tenderness over the inner lower right breast 3 weeks. Patient states initially there was a palpable abnormality in this area which has resolved. Patient is due for her annual bilateral mammogram. EXAM: DIGITAL DIAGNOSTIC bilateral MAMMOGRAM WITH CAD AND TOMO ULTRASOUND right BREAST COMPARISON:  Previous exam(s). ACR Breast Density Category b: There are scattered areas of fibroglandular density. FINDINGS: Exam demonstrates no focal abnormality over the inner lower right breast to account for patient's pain and palpable abnormality. Remainder of the right breast as well as the left breast is unchanged. Mammographic images were processed with CAD. Targeted  ultrasound is performed, showing no focal abnormality over the inner lower right breast to account for patient's focal pain/tenderness or palpable abnormality. IMPRESSION: No focal abnormality over the inner lower right breast to account for patient's pain/tenderness and palpable abnormality. RECOMMENDATION: Recommend continued management of patient's right breast pain/tenderness and palpable abnormality on a clinical basis. Otherwise, recommend continued annual bilateral screening mammographic follow-up. I have discussed the findings and recommendations with the patient. If applicable, a reminder letter will be sent to the patient regarding the next appointment. BI-RADS CATEGORY  1: Negative. Electronically Signed   By: Marin Olp M.D.   On: 05/29/2019 14:08     Assessment: 49 y.o. [id number] follow up for mastodynia  Plan: Problem List Items Addressed This Visit    None    Visit Diagnoses    Mastodynia of right breast    -  Primary     1) Mastodynia - negative exam today and improving symptoms negative diagnostic mammogram.  Continue conservative management.   2) Return in about 1 year (around 06/02/2020) for annual .   Malachy Mood, MD, [facility], [place]

## 2019-07-16 IMAGING — CR DG FOOT COMPLETE 3+V*R*
3 series · 3 of 3 positions shown · non-contrast
Comparison: None.

CLINICAL DATA: Acute onset pain

EXAM:
RIGHT FOOT COMPLETE - 3+ VIEW

[foot ap]
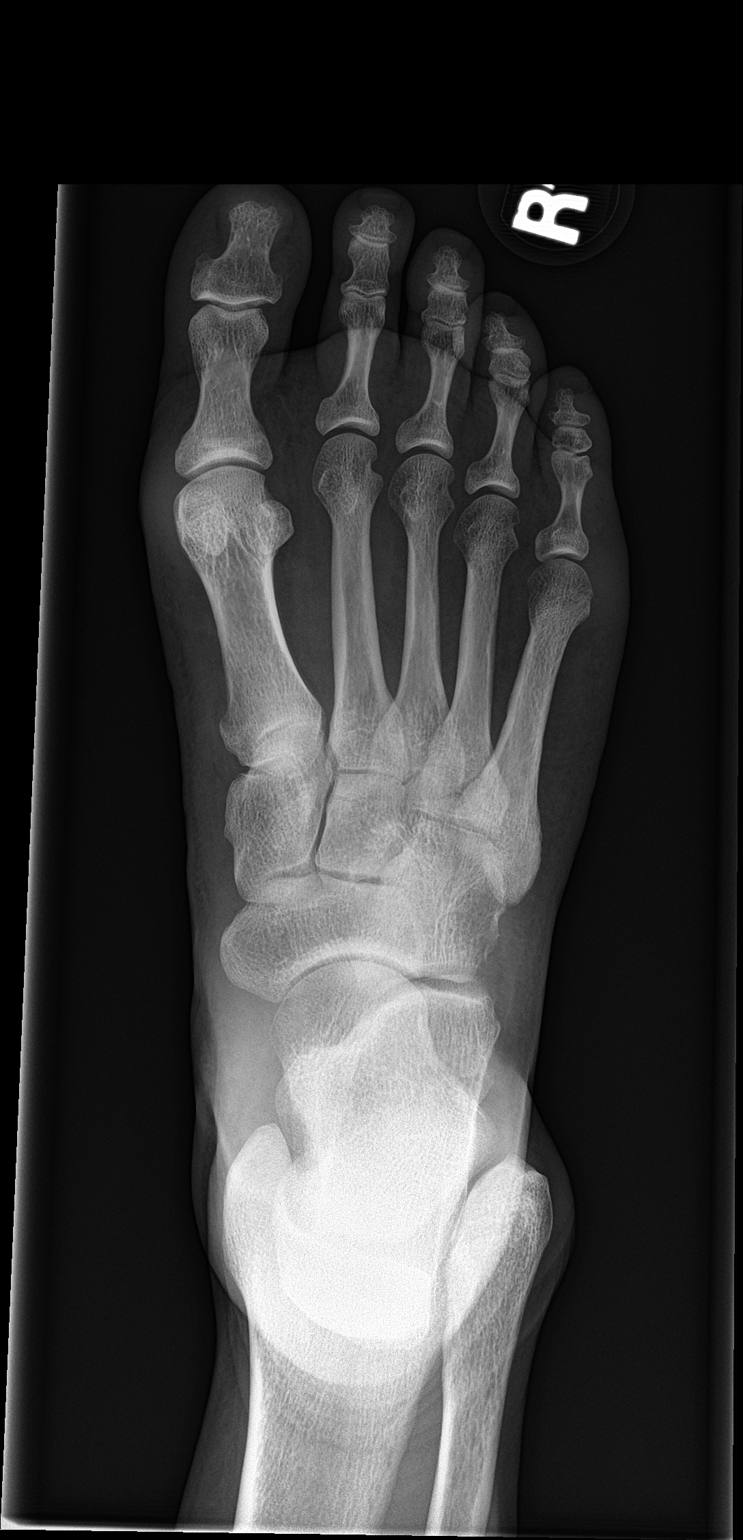

[foot obl]
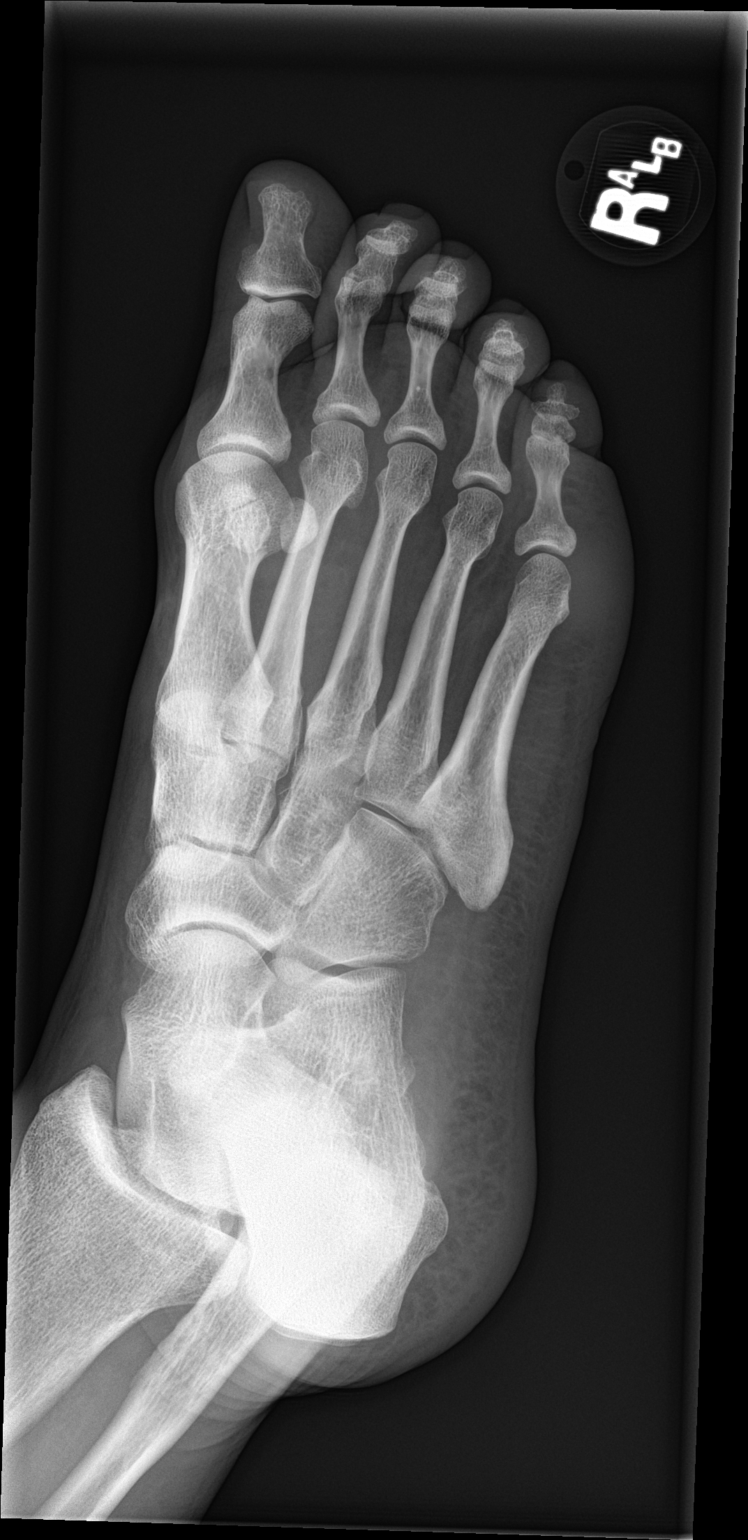

[foot lat]
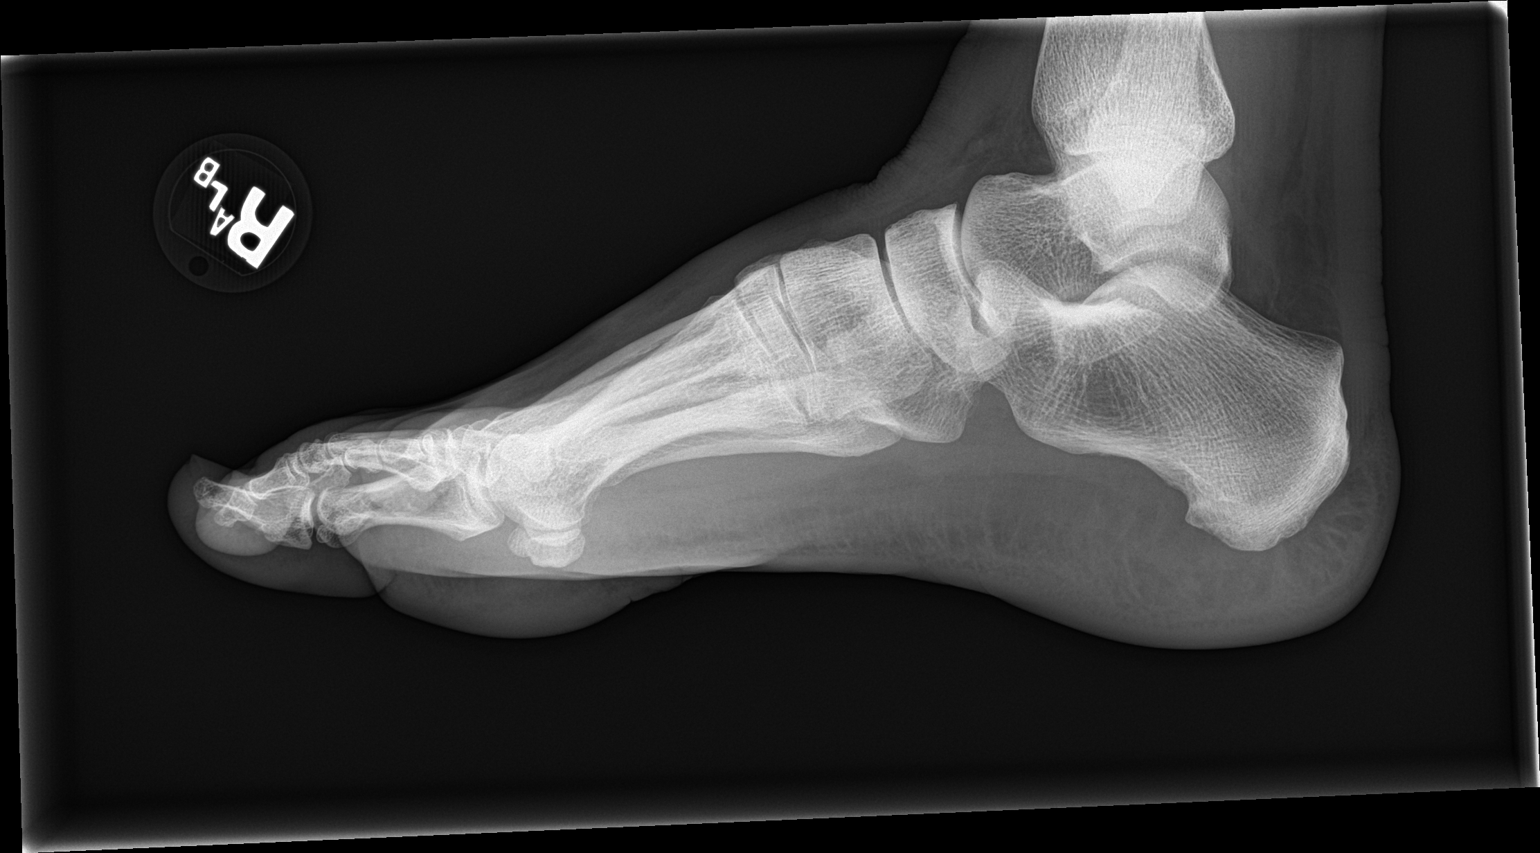

[3 of 3 positions shown; findings below may reference images not displayed]

FINDINGS: Frontal, oblique, and lateral views obtained. There is no evident
fracture or dislocation. Joint spaces appear normal. No erosive
change.
IMPRESSION: No fracture or dislocation.  No evident arthropathy.

## 2019-11-18 ENCOUNTER — Other Ambulatory Visit: Payer: Self-pay | Admitting: Obstetrics and Gynecology

## 2019-11-26 ENCOUNTER — Telehealth: Payer: Self-pay

## 2019-11-26 NOTE — Telephone Encounter (Signed)
Patient reports she has recurrent UTI. AMS had advised her to message him if she needed a rx. Cb#[phone]

## 2019-11-26 NOTE — Telephone Encounter (Addendum)
LMVM TRC w/details of symptoms and duration.

## 2019-11-26 NOTE — Telephone Encounter (Signed)
Patient reports she has had a dull pain in lower abd & back, for 3 days she has had the urge to urinate, but not always needing to, smell/cloudy color to urine, painful when ending urine stream.

## 2019-11-27 ENCOUNTER — Other Ambulatory Visit: Payer: Self-pay | Admitting: Obstetrics and Gynecology

## 2019-11-27 MED ORDER — NITROFURANTOIN MONOHYD MACRO 100 MG PO CAPS: 100.0000 mg | ORAL_CAPSULE | Freq: Two times a day (BID) | ORAL | 0 refills | Status: AC

## 2019-11-27 NOTE — Telephone Encounter (Signed)
Rx macrobid called in, if fails to see improvement need to come in for culture

## 2019-11-27 NOTE — Telephone Encounter (Signed)
Pt is aware.  

## 2020-03-22 ENCOUNTER — Other Ambulatory Visit: Payer: Self-pay

## 2020-03-22 ENCOUNTER — Ambulatory Visit
Admission: EM | Admit: 2020-03-22 | Discharge: 2020-03-22 | Disposition: A | Discharge status: Home / Self Care | Payer: Federal, State, Local not specified - PPO | Attending: Emergency Medicine | Admitting: Emergency Medicine

## 2020-03-22 DIAGNOSIS — Z20822 Contact with and (suspected) exposure to covid-19: Secondary | ICD-10-CM | POA: Diagnosis present

## 2020-03-22 DIAGNOSIS — B349 Viral infection, unspecified: Secondary | ICD-10-CM

## 2020-03-22 DIAGNOSIS — R059 Cough, unspecified: Secondary | ICD-10-CM | POA: Diagnosis not present

## 2020-03-22 DIAGNOSIS — M791 Myalgia, unspecified site: Secondary | ICD-10-CM | POA: Diagnosis not present

## 2020-03-22 DIAGNOSIS — U071 COVID-19: Secondary | ICD-10-CM | POA: Diagnosis not present

## 2020-03-22 LAB — RESP PANEL BY RT-PCR (FLU A&B, COVID) ARPGX2
Influenza A by PCR: NEGATIVE
Influenza B by PCR: NEGATIVE
SARS Coronavirus 2 by RT PCR: POSITIVE — AB

## 2020-03-22 NOTE — ED Triage Notes (Signed)
Pt states she was exposed to COVID positive person on Friday 12/24. Pt c/o productive with thick clear/yellow sputum, congestion, runny nose, body aches onset yesterday, nausea with 1 episode of vomiting yesterday. Reports irritated throat that pt speculates may be 2/2 coughing. Low grade fever 99  Pt states she had COVID infection in March 2021.  Positive home COVID test yesterday. No flu vaccine. Pt also c/o itchy rash to bilateral feet/hands that pt attributes to poison oak.  Denies SOB, ear pain. Has taken tylenol cold/flu-last taken last night. Has not taken BP meds 2/2 nausea for past two days. Last took lantus injection two nights ago. CBG 148 at 0800 per pt.

## 2020-03-22 NOTE — ED Provider Notes (Signed)
[facility]    CSN: [id number] Arrival date & time: 03/22/20  [id number]      History   Chief Complaint Chief Complaint  Patient presents with  . Cough    HPI Linda Roach is a 49 y.o. female presenting for complaints of productive cough, congestion, runny nose, body aches, nausea and vomiting daily yesterday.  Patient states she also has an irritated throat.  She admits to low-grade temperatures up to 99 degrees.  Patient states that she was exposed to COVID-19 4 days ago.  Patient has a personal history of COVID-19 in March 2021.  Patient states she took an at home Covid test that was positive yesterday.  Patient is not vaccinated for COVID-19.  Patient denies any associated weakness, sinus pain, ear pain, shortness of breath, diarrhea, or change in smell and taste.  She has taken Tylenol cold/flu.  Past medical history is significant for insulin-dependent diabetes and hypertension.  Patient states last A1c was 8.  No other complaints or concerns at this time.  HPI  Past Medical History:  Diagnosis Date  . Diabetes mellitus without complication ([place]) [id number]   Type 1 - DR. Curt Jews AT [facility] ENDOCRINOLOGY  . Family history of breast cancer    2/21 cancer genetic testing letter sent  . Genital warts   . History of Papanicolaou smear of cervix 02/21/2012L 06/22/2015   -/-; -/-  . Hyperlipidemia   . Hypertension 1998   Currently on lisinopril - well controlled  . Leiomyoma of uterus 2014, 2017   ESSENTIALLY 3X3, 3X3  . Stress incontinence 2000    Patient Active Problem List   Diagnosis Date Noted  . Routine general medical examination at a health care facility 10/16/2012  . Papular rash, localized 10/16/2012  . Screening for breast cancer 10/16/2012  . Diabetes mellitus type 2, uncontrolled ([place]) 10/16/2012  . Hypertension 10/16/2012    Past Surgical History:  Procedure Laterality Date  . INCONTINENCE SURGERY  03/10/13   Dr. Star Age  . INTRAUTERINE  DEVICE (IUD) INSERTION  06/08/2010  . TONSILLECTOMY  02/2013   Dr. Tami Ribas    OB History    Gravida  4   Para  1   Term  1   Preterm      AB  3   Living  1     SAB      IAB      Ectopic      Multiple      Live Births  1            Home Medications    Prior to Admission medications   Medication Sig Start Date End Date Taking? Authorizing Provider  insulin glargine (LANTUS) 100 UNIT/ML injection Inject 25 Units into the skin at bedtime.   Yes [provider]  insulin lispro (HUMALOG) 100 UNIT/ML injection As needed for elevated blood sugar 07/03/13  Yes Rey, Raquel M, NP  levonorgestrel (MIRENA) 20 MCG/24HR IUD 1 each by Intrauterine route once.   Yes [provider]  lisinopril (ZESTRIL) 20 MG tablet TAKE 1 TABLET BY MOUTH EVERY DAY 02/26/19  Yes Malachy Mood, MD  metFORMIN (GLUCOPHAGE) 1000 MG tablet Take 1 tablet (1,000 mg total) by mouth 2 (two) times daily with a meal. 08/04/14  Yes Doss, Velora Heckler, RN  Semaglutide,0.25 or 0.5MG /DOS, (OZEMPIC, 0.25 OR 0.5 MG/DOSE,) 2 MG/1.5ML SOPN Every sunday 05/12/19  Yes [provider]  valACYclovir (VALTREX) 500 MG tablet TAKE 1 TABLET(500 MG) BY MOUTH  DAILY 11/19/19  Yes Vena Austria, MD  acetaminophen (TYLENOL) 500 MG tablet Take 500 mg by mouth daily as needed for pain.    [provider]  omeprazole (PRILOSEC) 20 MG capsule Take by mouth.    [provider]    Family History Family History  Problem Relation Age of Onset  . Heart disease Father 54       CAD s/p quadruple bypass  . Diabetes Father        TYPE 2  . Arthritis Brother   . Hyperlipidemia Brother   . Hypertension Brother   . Diabetes Maternal Grandmother   . Cancer Maternal Grandmother 60       COLON  . Heart disease Paternal Grandmother   . Diabetes Paternal Grandmother   . Breast cancer Paternal Grandmother 45  . Prostate cancer Paternal Grandfather 22    Social History Social History    Tobacco Use  . Smoking status: Never Smoker  . Smokeless tobacco: Never Used  Vaping Use  . Vaping Use: Never used  Substance Use Topics  . Alcohol use: Yes    Comment: Has a couple of drinks monthly  . Drug use: No     Allergies   Kiwi extract   Review of Systems Review of Systems  Constitutional: Positive for fatigue. Negative for chills, diaphoresis and fever.  HENT: Positive for congestion, rhinorrhea and sore throat. Negative for ear pain, sinus pressure and sinus pain.   Respiratory: Positive for cough. Negative for shortness of breath and wheezing.   Cardiovascular: Negative for chest pain.  Gastrointestinal: Negative for abdominal pain, nausea and vomiting.  Musculoskeletal: Positive for myalgias. Negative for arthralgias.  Skin: Negative for rash.  Neurological: Positive for headaches. Negative for weakness.  Hematological: Negative for adenopathy.     Physical Exam Triage Vital Signs ED Triage Vitals  Enc Vitals Group     BP 03/22/20 1013 (!) 143/84     Pulse Rate 03/22/20 1013 97     Resp 03/22/20 1013 18     Temp 03/22/20 1013 99.1 F (37.3 C)     Temp Source 03/22/20 1013 Oral     SpO2 03/22/20 1013 99 %     Weight --      Height --      Head Circumference --      Peak Flow --      Pain Score 03/22/20 1007 6     Pain Loc --      Pain Edu? --      Excl. in GC? --    No data found.  Updated Vital Signs BP (!) 143/84 (BP Location: Left Arm)   Pulse 97   Temp 99.1 F (37.3 C) (Oral)   Resp 18   SpO2 99%      Physical Exam Vitals and nursing note reviewed.  Constitutional:      General: She is not in acute distress.    Appearance: Normal appearance. She is not ill-appearing or toxic-appearing.  HENT:     Head: Normocephalic and atraumatic.     Nose: Congestion present.     Mouth/Throat:     Mouth: Mucous membranes are moist.     Pharynx: Oropharynx is clear. Posterior oropharyngeal erythema present.  Eyes:     General: No scleral  icterus.       Right eye: No discharge.        Left eye: No discharge.     Conjunctiva/sclera: Conjunctivae normal.  Cardiovascular:  Rate and Rhythm: Normal rate and regular rhythm.     Heart sounds: Normal heart sounds.  Pulmonary:     Effort: Pulmonary effort is normal. No respiratory distress.     Breath sounds: Normal breath sounds. No wheezing, rhonchi or rales.  Musculoskeletal:     Cervical back: Neck supple.  Skin:    General: Skin is dry.  Neurological:     General: No focal deficit present.     Mental Status: She is alert. Mental status is at baseline.     Motor: No weakness.     Gait: Gait normal.  Psychiatric:        Mood and Affect: Mood normal.        Behavior: Behavior normal.        Thought Content: Thought content normal.      UC Treatments / Results  Labs (all labs ordered are listed, but only abnormal results are displayed) Labs Reviewed  RESP PANEL BY RT-PCR (FLU A&B, COVID) [id number]    EKG   Radiology No results found.  Procedures Procedures (including critical care time)  Medications Ordered in UC Medications - No data to display  Initial Impression / Assessment and Plan / UC Course  I have reviewed the triage vital signs and the nursing notes.  Pertinent labs & imaging results that were available during my care of the patient were reviewed by me and considered in my medical decision making (see chart for details).   49 year old unvaccinated female had positive COVID-19 exposure.  Patient high risk for type 1 diabetes.  Patient presenting with symptoms with onset yesterday.  Symptoms including cough, body aches, sore throat, congestion, headaches and fatigue.  All vital signs are stable and patient is no acute distress.  Exam significant for mild congestion and rhinorrhea with slight posterior pharyngeal erythema.  Chest is clear to auscultation heart regular rate and rhythm.  Respiratory panel obtained.  Advised patient I will call her  with the results.  CDC guidance, isolation protocol and ED precautions reviewed with patient.  Provide supportive care with increasing rest and fluids.  Advised over-the-counter decongestants and nasal sprays.  Advised that she can continue the Tylenol cold and flu if that is helping.  Positive COVID-19 test.  Discussed results with patient.  Contacted infusion center for MAB therapy since patient is a candidate.  Reviewed ED precautions again with patient.  Final Clinical Impressions(s) / UC Diagnoses   Final diagnoses:  Viral illness  Cough  Myalgia  Exposure to COVID-19 virus  COVID-19     Discharge Instructions     I will call with results of your Covid and flu test.  If you test positive for Covid I can refer you to the infusion center for monoclonal antibody therapy.  You have received COVID testing today either for positive exposure, concerning symptoms that could be related to COVID infection, screening purposes, or re-testing after confirmed positive.  Your test obtained today checks for active viral infection in the last 1-2 weeks. If your test is negative now, you can still test positive later. So, if you do develop symptoms you should either get re-tested and/or isolate x 10 days. Please follow CDC guidelines.  While Rapid antigen tests come back in 15-20 minutes, send out PCR/molecular test results typically come back within 24 hours. In the mean time, if you are symptomatic, assume this could be a positive test and treat/monitor yourself as if you do have COVID.   We will call with  test results. Please download the MyChart app and set up a profile to access test results.   If symptomatic, go home and rest. Push fluids. Take Tylenol as needed for discomfort. Gargle warm salt water. Throat lozenges. Take Mucinex DM or Robitussin for cough. Humidifier in bedroom to ease coughing. Warm showers. Also review the COVID handout for more information.  COVID-19 INFECTION: The  incubation period of COVID-19 is approximately 14 days after exposure, with most symptoms developing in roughly 4-5 days. Symptoms may range in severity from mild to critically severe. Roughly 80% of those infected will have mild symptoms. People of any age may become infected with COVID-19 and have the ability to transmit the virus. The most common symptoms include: fever, fatigue, cough, body aches, headaches, sore throat, nasal congestion, shortness of breath, nausea, vomiting, diarrhea, changes in smell and/or taste.    COURSE OF ILLNESS Some patients may begin with mild disease which can progress quickly into critical symptoms. If your symptoms are worsening please call ahead to the Emergency Department and proceed there for further treatment. Recovery time appears to be roughly 1-2 weeks for mild symptoms and 3-6 weeks for severe disease.   GO IMMEDIATELY TO ER FOR FEVER YOU ARE UNABLE TO GET DOWN WITH TYLENOL, BREATHING PROBLEMS, CHEST PAIN, FATIGUE, LETHARGY, INABILITY TO EAT OR DRINK, ETC  QUARANTINE AND ISOLATION: To help decrease the spread of COVID-19 please remain isolated if you have COVID infection or are highly suspected to have COVID infection. This means -stay home and isolate to one room in the home if you live with others. Do not share a bed or bathroom with others while ill, sanitize and wipe down all countertops and keep common areas clean and disinfected. You may discontinue isolation if you have a mild case and are asymptomatic 10 days after symptom onset as long as you have been fever free >24 hours without having to take Motrin or Tylenol. If your case is more severe (meaning you develop pneumonia or are admitted in the hospital), you may have to isolate longer.   If you have been in close contact (within 6 feet) of someone diagnosed with COVID 19, you are advised to quarantine in your home for 14 days as symptoms can develop anywhere from 2-14 days after exposure to the virus. If  you develop symptoms, you  must isolate.  Most current guidelines for COVID after exposure -isolate 10 days if you ARE NOT tested for COVID as long as symptoms do not develop -isolate 7 days if you are tested and remain asymptomatic -You do not necessarily need to be tested for COVID if you have + exposure and        develop   symptoms. Just isolate at home x10 days from symptom onset During this global pandemic, CDC advises to practice social distancing, try to stay at least 46ft away from others at all times. Wear a face covering. Wash and sanitize your hands regularly and avoid going anywhere that is not necessary.  KEEP IN MIND THAT THE COVID TEST IS NOT 100% ACCURATE AND YOU SHOULD STILL DO EVERYTHING TO PREVENT POTENTIAL SPREAD OF VIRUS TO OTHERS (WEAR MASK, WEAR GLOVES, [place] HANDS AND SANITIZE REGULARLY). IF INITIAL TEST IS NEGATIVE, THIS MAY NOT MEAN YOU ARE DEFINITELY NEGATIVE. MOST ACCURATE TESTING IS DONE 5-7 DAYS AFTER EXPOSURE.   It is not advised by CDC to get re-tested after receiving a positive COVID test since you can still test positive for weeks to months  after you have already cleared the virus.   *If you have not been vaccinated for COVID, I strongly suggest you consider getting vaccinated as long as there are no contraindications.      ED Prescriptions    None     PDMP not reviewed this encounter.   Danton Clap, PA-C 03/22/20 1110

## 2020-03-22 NOTE — Discharge Instructions (Signed)
I will call with results of your Covid and flu test.  If you test positive for Covid I can refer you to the infusion center for monoclonal antibody therapy.  You have received COVID testing today either for positive exposure, concerning symptoms that could be related to COVID infection, screening purposes, or re-testing after confirmed positive.  Your test obtained today checks for active viral infection in the last 1-2 weeks. If your test is negative now, you can still test positive later. So, if you do develop symptoms you should either get re-tested and/or isolate x 10 days. Please follow CDC guidelines.  While Rapid antigen tests come back in 15-20 minutes, send out PCR/molecular test results typically come back within 24 hours. In the mean time, if you are symptomatic, assume this could be a positive test and treat/monitor yourself as if you do have COVID.   We will call with test results. Please download the MyChart app and set up a profile to access test results.   If symptomatic, go home and rest. Push fluids. Take Tylenol as needed for discomfort. Gargle warm salt water. Throat lozenges. Take Mucinex DM or Robitussin for cough. Humidifier in bedroom to ease coughing. Warm showers. Also review the COVID handout for more information.  COVID-19 INFECTION: The incubation period of COVID-19 is approximately 14 days after exposure, with most symptoms developing in roughly 4-5 days. Symptoms may range in severity from mild to critically severe. Roughly 80% of those infected will have mild symptoms. People of any age may become infected with COVID-19 and have the ability to transmit the virus. The most common symptoms include: fever, fatigue, cough, body aches, headaches, sore throat, nasal congestion, shortness of breath, nausea, vomiting, diarrhea, changes in smell and/or taste.    COURSE OF ILLNESS Some patients may begin with mild disease which can progress quickly into critical symptoms. If your  symptoms are worsening please call ahead to the Emergency Department and proceed there for further treatment. Recovery time appears to be roughly 1-2 weeks for mild symptoms and 3-6 weeks for severe disease.   GO IMMEDIATELY TO ER FOR FEVER YOU ARE UNABLE TO GET DOWN WITH TYLENOL, BREATHING PROBLEMS, CHEST PAIN, FATIGUE, LETHARGY, INABILITY TO EAT OR DRINK, ETC  QUARANTINE AND ISOLATION: To help decrease the spread of COVID-19 please remain isolated if you have COVID infection or are highly suspected to have COVID infection. This means -stay home and isolate to one room in the home if you live with others. Do not share a bed or bathroom with others while ill, sanitize and wipe down all countertops and keep common areas clean and disinfected. You may discontinue isolation if you have a mild case and are asymptomatic 10 days after symptom onset as long as you have been fever free >24 hours without having to take Motrin or Tylenol. If your case is more severe (meaning you develop pneumonia or are admitted in the hospital), you may have to isolate longer.   If you have been in close contact (within 6 feet) of someone diagnosed with COVID 19, you are advised to quarantine in your home for 14 days as symptoms can develop anywhere from 2-14 days after exposure to the virus. If you develop symptoms, you  must isolate.  Most current guidelines for COVID after exposure -isolate 10 days if you ARE NOT tested for COVID as long as symptoms do not develop -isolate 7 days if you are tested and remain asymptomatic -You do not necessarily need to  be tested for COVID if you have + exposure and        develop   symptoms. Just isolate at home x10 days from symptom onset During this global pandemic, CDC advises to practice social distancing, try to stay at least 42ft away from others at all times. Wear a face covering. Wash and sanitize your hands regularly and avoid going anywhere that is not necessary.  KEEP IN MIND THAT  THE COVID TEST IS NOT 100% ACCURATE AND YOU SHOULD STILL DO EVERYTHING TO PREVENT POTENTIAL SPREAD OF VIRUS TO OTHERS (WEAR MASK, WEAR GLOVES, WASH HANDS AND SANITIZE REGULARLY). IF INITIAL TEST IS NEGATIVE, THIS MAY NOT MEAN YOU ARE DEFINITELY NEGATIVE. MOST ACCURATE TESTING IS DONE 5-7 DAYS AFTER EXPOSURE.   It is not advised by CDC to get re-tested after receiving a positive COVID test since you can still test positive for weeks to months after you have already cleared the virus.   *If you have not been vaccinated for COVID, I strongly suggest you consider getting vaccinated as long as there are no contraindications.

## 2020-03-28 ENCOUNTER — Other Ambulatory Visit: Payer: Self-pay | Admitting: Obstetrics and Gynecology

## 2020-06-30 ENCOUNTER — Other Ambulatory Visit: Payer: Self-pay | Admitting: Obstetrics and Gynecology

## 2020-06-30 MED ORDER — NITROFURANTOIN MONOHYD MACRO 100 MG PO CAPS: 100.0000 mg | ORAL_CAPSULE | Freq: Two times a day (BID) | ORAL | 0 refills | Status: AC

## 2020-08-18 ENCOUNTER — Other Ambulatory Visit: Payer: Self-pay

## 2020-08-18 ENCOUNTER — Encounter: Payer: Self-pay | Admitting: Emergency Medicine

## 2020-08-18 ENCOUNTER — Ambulatory Visit
Admission: EM | Admit: 2020-08-18 | Discharge: 2020-08-18 | Disposition: A | Discharge status: Home / Self Care | Payer: Federal, State, Local not specified - PPO | Attending: Family Medicine | Admitting: Family Medicine

## 2020-08-18 DIAGNOSIS — R0789 Other chest pain: Secondary | ICD-10-CM

## 2020-08-18 MED ORDER — MELOXICAM 15 MG PO TABS: 15.0000 mg | ORAL_TABLET | Freq: Every day | ORAL | 0 refills | Status: DC | PRN

## 2020-08-18 NOTE — ED Triage Notes (Signed)
Patient c/o left side chest pain that started this morning, especially when she takes a deep breath. She also reports SOB this morning and tingling in both of her arms.

## 2020-08-18 NOTE — ED Provider Notes (Signed)
[facility]    CSN: [id number] Arrival date & time: 08/18/20  1937      History   Chief Complaint Chief Complaint  Patient presents with  . Chest Pain  . Shortness of Breath   HPI  50 year old female presents with the above complaints.  Started around 5:00 this morning.  She reports left-sided chest pain.  She reports some associated shortness of breath particular with activity.  She states that she has had some tingling in her upper extremities.  She states that the pain is sharp and has improved and is now a dull ache.  Pain 6/10 in severity.  Patient states that given her risk factors and family history of cardiac disease she thought it would be best that she come in for evaluation.  No reports of diaphoresis.  No reports of nausea vomiting.  Pain does not radiate.  Past Medical History:  Diagnosis Date  . Diabetes mellitus without complication ([place]) 9024   Type 1 - DR. Curt Jews AT [facility] ENDOCRINOLOGY  . Family history of breast cancer    2/21 cancer genetic testing letter sent  . Genital warts   . History of Papanicolaou smear of cervix 02/21/2012L 06/22/2015   -/-; -/-  . Hyperlipidemia   . Hypertension 1998   Currently on lisinopril - well controlled  . Leiomyoma of uterus 2014, 2017   ESSENTIALLY 3X3, 3X3  . Stress incontinence 2000    Patient Active Problem List   Diagnosis Date Noted  . Routine general medical examination at a health care facility 10/16/2012  . Papular rash, localized 10/16/2012  . Screening for breast cancer 10/16/2012  . Diabetes mellitus type 2, uncontrolled ([place]) 10/16/2012  . Hypertension 10/16/2012    Past Surgical History:  Procedure Laterality Date  . INCONTINENCE SURGERY  03/10/13   Dr. Star Age  . INTRAUTERINE DEVICE (IUD) INSERTION  06/08/2010  . TONSILLECTOMY  02/2013   Dr. Tami Ribas    OB History    Gravida  4   Para  1   Term  1   Preterm      AB  3   Living  1     SAB      IAB      Ectopic       Multiple      Live Births  1            Home Medications    Prior to Admission medications   Medication Sig Start Date End Date Taking? Authorizing Provider  acetaminophen (TYLENOL) 500 MG tablet Take 500 mg by mouth daily as needed for pain.   Yes [provider]  insulin glargine (LANTUS) 100 UNIT/ML injection Inject 25 Units into the skin at bedtime.   Yes [provider]  insulin lispro (HUMALOG) 100 UNIT/ML injection As needed for elevated blood sugar 07/03/13  Yes Rey, Raquel M, NP  levonorgestrel (MIRENA) 20 MCG/24HR IUD 1 each by Intrauterine route once.   Yes [provider]  lisinopril (ZESTRIL) 20 MG tablet TAKE 1 TABLET BY MOUTH EVERY DAY 03/29/20  Yes Malachy Mood, MD  meloxicam (MOBIC) 15 MG tablet Take 1 tablet (15 mg total) by mouth daily as needed. 08/18/20  Yes Jelicia Nantz G, DO  metFORMIN (GLUCOPHAGE) 1000 MG tablet Take 1 tablet (1,000 mg total) by mouth 2 (two) times daily with a meal. 08/04/14  Yes Doss, Velora Heckler, RN  omeprazole (PRILOSEC) 20 MG capsule Take by mouth.   Yes [provider]  Semaglutide,0.25 or 0.5MG /DOS, (OZEMPIC, 0.25 OR 0.5 MG/DOSE,) 2 MG/1.5ML SOPN Every sunday 05/12/19  Yes [provider]  valACYclovir (VALTREX) 500 MG tablet TAKE 1 TABLET(500 MG) BY MOUTH DAILY 11/19/19  Yes Malachy Mood, MD    Family History Family History  Problem Relation Age of Onset  . Heart disease Father 27       CAD s/p quadruple bypass  . Diabetes Father        TYPE 2  . Arthritis Brother   . Hyperlipidemia Brother   . Hypertension Brother   . Diabetes Maternal Grandmother   . Cancer Maternal Grandmother 60       COLON  . Heart disease Paternal Grandmother   . Diabetes Paternal Grandmother   . Breast cancer Paternal Grandmother 65  . Prostate cancer Paternal Grandfather 24    Social History Social History   Tobacco Use  . Smoking status: Never Smoker  . Smokeless tobacco: Never Used  Vaping  Use  . Vaping Use: Never used  Substance Use Topics  . Alcohol use: Yes    Comment: Has a couple of drinks monthly  . Drug use: No     Allergies   Kiwi extract   Review of Systems Review of Systems  Respiratory: Positive for shortness of breath.   Cardiovascular: Positive for chest pain.   Physical Exam Triage Vital Signs ED Triage Vitals  Enc Vitals Group     BP 08/18/20 1050 135/72     Pulse Rate 08/18/20 1050 79     Resp 08/18/20 1050 18     Temp 08/18/20 1050 98 F (36.7 C)     Temp Source 08/18/20 1050 Oral     SpO2 08/18/20 1050 99 %     Weight 08/18/20 1049 185 lb (83.9 kg)     Height 08/18/20 1049 5\' 5"  (1.651 m)     Head Circumference --      Peak Flow --      Pain Score 08/18/20 1049 6     Pain Loc --      Pain Edu? --      Excl. in [place]? --    No data found.  Updated Vital Signs BP 135/72 (BP Location: Left Arm)   Pulse 79   Temp 98 F (36.7 C) (Oral)   Resp 18   Ht 5\' 5"  (1.651 m)   Wt 83.9 kg   SpO2 99%   BMI 30.79 kg/m   Visual Acuity Right Eye Distance:   Left Eye Distance:   Bilateral Distance:    Right Eye Near:   Left Eye Near:    Bilateral Near:     Physical Exam Vitals and nursing note reviewed.  Constitutional:      General: She is not in acute distress.    Appearance: Normal appearance. She is not ill-appearing.  Eyes:     General:        Right eye: No discharge.        Left eye: No discharge.     Conjunctiva/sclera: Conjunctivae normal.  Cardiovascular:     Rate and Rhythm: Normal rate and regular rhythm.     Heart sounds: No murmur heard.   Pulmonary:     Effort: Pulmonary effort is normal.     Breath sounds: Normal breath sounds. No wheezing or rales.  Chest:     Chest wall: Tenderness present.  Neurological:     Mental Status: She is alert.  Psychiatric:  Mood and Affect: Mood normal.        Behavior: Behavior normal.    UC Treatments / Results  Labs (all labs ordered are listed, but only abnormal  results are displayed) Labs Reviewed - No data to display  EKG Interpretation: Normal sinus rhythm with rate of 80.  Normal axis.  Normal intervals.  No ST or T wave changes.  Normal EKG.  Radiology No results found.  Procedures Procedures (including critical care time)  Medications Ordered in UC Medications - No data to display  Initial Impression / Assessment and Plan / UC Course  I have reviewed the triage vital signs and the nursing notes.  Pertinent labs & imaging results that were available during my care of the patient were reviewed by me and considered in my medical decision making (see chart for details).    50 year old female presents with left-sided chest pain.  This appears to be musculoskeletal as she is tender on exam.  Her EKG is normal.  Meloxicam as directed.  I advised her to go to the hospital if this worsens or changes.  Final Clinical Impressions(s) / UC Diagnoses   Final diagnoses:  Chest wall pain   Discharge Instructions   None    ED Prescriptions    Medication Sig Dispense Auth. Provider   meloxicam (MOBIC) 15 MG tablet Take 1 tablet (15 mg total) by mouth daily as needed. 30 tablet Coral Spikes, DO     PDMP not reviewed this encounter.   Coral Spikes, DO 08/18/20 1230

## 2020-08-27 ENCOUNTER — Other Ambulatory Visit: Payer: Self-pay | Admitting: Obstetrics and Gynecology

## 2020-10-12 ENCOUNTER — Telehealth: Payer: Federal, State, Local not specified - PPO

## 2020-10-12 NOTE — Telephone Encounter (Signed)
Pt calling; has sxs of UTI; tx'd c macrobid which didn't help sxs; tx'd for yeast inf which didn't help sxs; has constant urge to urinate, stinging c urination, swollen lymph node gland ping pong ball sized  inside inner thigh, feverish, pain in lower abd, concerned IUD has a problem or some sort of lower abd infection.  Front desk told her to lm to see if can come in for u/a drop off to get the ball rolling so she doesn't have to wait until 8/4th.  [phone]

## 2020-10-12 NOTE — Telephone Encounter (Signed)
Pt aware to go to Urgent Care today.

## 2020-10-12 NOTE — Telephone Encounter (Signed)
Per JEG ( verbal) patient to follow up with Urgent care due to no opening for today

## 2021-04-11 ENCOUNTER — Other Ambulatory Visit: Payer: Self-pay | Admitting: Obstetrics and Gynecology

## 2022-03-27 ENCOUNTER — Other Ambulatory Visit: Payer: Self-pay

## 2022-06-12 ENCOUNTER — Encounter: Payer: Self-pay | Admitting: Obstetrics and Gynecology

## 2022-06-12 DIAGNOSIS — Z1231 Encounter for screening mammogram for malignant neoplasm of breast: Secondary | ICD-10-CM

## 2022-08-14 ENCOUNTER — Ambulatory Visit
Admission: EM | Admit: 2022-08-14 | Discharge: 2022-08-14 | Disposition: A | Discharge status: Home / Self Care | Payer: Federal, State, Local not specified - PPO | Attending: Emergency Medicine | Admitting: Emergency Medicine

## 2022-08-14 DIAGNOSIS — J069 Acute upper respiratory infection, unspecified: Secondary | ICD-10-CM | POA: Diagnosis not present

## 2022-08-14 MED ORDER — BENZONATATE 100 MG PO CAPS: 100.0000 mg | ORAL_CAPSULE | Freq: Three times a day (TID) | ORAL | 0 refills | Status: DC

## 2022-08-14 NOTE — ED Triage Notes (Signed)
Patient with c/o cough that started Friday. Patient states now she feels like her chest is "rattling" and she is coughing up some yellow tinted sputum. States she has coughed so much her chest is sore.

## 2022-08-14 NOTE — ED Provider Notes (Signed)
[facility]    CSN: [id number] Arrival date & time: 08/14/22  0806      History   Chief Complaint Chief Complaint  Patient presents with   Cough    HPI Linda Roach is a 52 y.o. female.   53 year old female, Linda Roach, presents to urgent care chief complaint of cough x 4 days.  Patient states she feels like she has rattling in her chest and is coughing up yellow sputum.  Patient does not drink, smoke, or take any drugs  The history is provided by the patient. No language interpreter was used.    Past Medical History:  Diagnosis Date   Diabetes mellitus without complication ([facility]) 1998   Type 1 - DR. Ethelene Hal AT [facility]OCRINOLOGY   Family history of breast cancer    2/21 cancer genetic testing letter sent   Genital warts    History of Papanicolaou smear of cervix 02/21/2012L 06/22/2015   -/-; -/-   Hyperlipidemia    Hypertension 1998   Currently on lisinopril - well controlled   Leiomyoma of uterus 2014, 2017   ESSENTIALLY 3X3, 3X3   Stress incontinence 2000    Patient Active Problem List   Diagnosis Date Noted   Viral URI with cough 08/14/2022   Routine general medical examination at a health care facility 10/16/2012   Papular rash, localized 10/16/2012   Screening for breast cancer 10/16/2012   Diabetes mellitus type 2, uncontrolled 10/16/2012   Hypertension 10/16/2012    Past Surgical History:  Procedure Laterality Date   INCONTINENCE SURGERY  03/10/13   Dr. Chauncey Cruel   INTRAUTERINE DEVICE (IUD) INSERTION  06/08/2010   TONSILLECTOMY  02/2013   Dr. Jenne Campus    OB History     Gravida  4   Para  1   Term  1   Preterm      AB  3   Living  1      SAB      IAB      Ectopic      Multiple      Live Births  1            Home Medications    Prior to Admission medications   Medication Sig Start Date End Date Taking? Authorizing Provider  benzonatate (TESSALON) 100 MG capsule Take 1 capsule (100 mg total) by  mouth every 8 (eight) hours. 08/14/22  Yes Madyx Delfin, Para March, NP  acetaminophen (TYLENOL) 500 MG tablet Take 500 mg by mouth daily as needed for pain.    [provider]  insulin glargine (LANTUS) 100 UNIT/ML injection Inject 25 Units into the skin at bedtime.    [provider]  insulin lispro (HUMALOG) 100 UNIT/ML injection As needed for elevated blood sugar 07/03/13   Rey, Raquel M, NP  levonorgestrel (MIRENA) 20 MCG/24HR IUD 1 each by Intrauterine route once.    [provider]  lisinopril (ZESTRIL) 20 MG tablet TAKE 1 TABLET BY MOUTH EVERY DAY 04/12/21   Vena Austria, MD  meloxicam (MOBIC) 15 MG tablet Take 1 tablet (15 mg total) by mouth daily as needed. 08/18/20   Tommie Sams, DO  metFORMIN (GLUCOPHAGE) 1000 MG tablet Take 1 tablet (1,000 mg total) by mouth 2 (two) times daily with a meal. 08/04/14   Doss, Oleh Genin, RN  omeprazole (PRILOSEC) 20 MG capsule Take by mouth.    [provider]  Semaglutide,0.25 or 0.5MG /DOS, (OZEMPIC, 0.25 OR 0.5 MG/DOSE,) 2 MG/1.5ML SOPN Every  sunday 05/12/19   [provider]  valACYclovir (VALTREX) 500 MG tablet TAKE 1 TABLET(500 MG) BY MOUTH DAILY 04/12/21   Vena Austria, MD    Family History Family History  Problem Relation Age of Onset   Heart disease Father 29       CAD s/p quadruple bypass   Diabetes Father        TYPE 2   Arthritis Brother    Hyperlipidemia Brother    Hypertension Brother    Diabetes Maternal Grandmother    Cancer Maternal Grandmother 70       COLON   Heart disease Paternal Grandmother    Diabetes Paternal Grandmother    Breast cancer Paternal Grandmother 63   Prostate cancer Paternal Grandfather 59    Social History Social History   Tobacco Use   Smoking status: Never   Smokeless tobacco: Never  Vaping Use   Vaping Use: Never used  Substance Use Topics   Alcohol use: Yes    Comment: Has a couple of drinks monthly   Drug use: No     Allergies   Kiwi  extract   Review of Systems Review of Systems  Constitutional:  Negative for fever.  HENT:  Positive for congestion.   Respiratory:  Positive for cough. Negative for shortness of breath, wheezing and stridor.   All other systems reviewed and are negative.    Physical Exam Triage Vital Signs ED Triage Vitals [08/14/22 0831]  Enc Vitals Group     BP (!) 120/99     Pulse Rate 77     Resp 18     Temp 98.2 F (36.8 C)     Temp Source Oral     SpO2 100 %     Weight      Height      Head Circumference      Peak Flow      Pain Score 7     Pain Loc      Pain Edu?      Excl. in [place]?    No data found.  Updated Vital Signs BP (!) 120/99 (BP Location: Right Arm)   Pulse 77   Temp 98.2 F (36.8 C) (Oral)   Resp 18   SpO2 100%   Visual Acuity Right Eye Distance:   Left Eye Distance:   Bilateral Distance:    Right Eye Near:   Left Eye Near:    Bilateral Near:     Physical Exam Vitals and nursing note reviewed.  HENT:     Head: Normocephalic.  Neck:     Trachea: Trachea normal.  Cardiovascular:     Rate and Rhythm: Normal rate and regular rhythm.     Pulses: Normal pulses.     Heart sounds: Normal heart sounds.  Pulmonary:     Effort: Pulmonary effort is normal.     Breath sounds: Normal breath sounds and air entry.  Neurological:     General: No focal deficit present.     Mental Status: She is alert and oriented to person, place, and time.     GCS: GCS eye subscore is 4. GCS verbal subscore is 5. GCS motor subscore is 6.  Psychiatric:        Attention and Perception: Attention normal.        Mood and Affect: Mood normal.        Speech: Speech normal.        Behavior: Behavior normal. Behavior is cooperative.  UC Treatments / Results  Labs (all labs ordered are listed, but only abnormal results are displayed) Labs Reviewed - No data to display  EKG   Radiology No results found.  Procedures Procedures (including critical care  time)  Medications Ordered in UC Medications - No data to display  Initial Impression / Assessment and Plan / UC Course  I have reviewed the triage vital signs and the nursing notes.  Pertinent labs & imaging results that were available during my care of the patient were reviewed by me and considered in my medical decision making (see chart for details).     Ddx: Viral illness, allergies Final Clinical Impressions(s) / UC Diagnoses   Final diagnoses:  Viral URI with cough     Discharge Instructions      Most likely you have a viral illness: no antibiotic as indicated at this time, May treat with OTC meds of choice(zyrtec,claritin, allegra(pick one), flonase nasal spray, chloraseptic throat lozenges. Make sure to drink plenty of fluids to stay hydrated(gatorade, water, popsicles,jello,etc), avoid caffeine products. Follow up with PCP. Return as needed.     ED Prescriptions     Medication Sig Dispense Auth. Provider   benzonatate (TESSALON) 100 MG capsule Take 1 capsule (100 mg total) by mouth every 8 (eight) hours. 21 capsule Kyrra Prada, Para March, NP      PDMP not reviewed this encounter.   Clancy Gourd, NP 08/14/22 [phone]

## 2022-08-14 NOTE — Discharge Instructions (Addendum)
Most likely you have a viral illness: no antibiotic as indicated at this time, May treat with OTC meds of choice(zyrtec,claritin, allegra(pick one), flonase nasal spray, chloraseptic throat lozenges. Make sure to drink plenty of fluids to stay hydrated(gatorade, water, popsicles,jello,etc), avoid caffeine products. Follow up with PCP. Return as needed.

## 2023-05-17 ENCOUNTER — Other Ambulatory Visit: Payer: Self-pay | Admitting: Obstetrics and Gynecology

## 2023-05-17 ENCOUNTER — Ambulatory Visit
Admission: RE | Admit: 2023-05-17 | Discharge: 2023-05-17 | Disposition: A | Discharge status: Home / Self Care | Payer: Federal, State, Local not specified - PPO | Source: Ambulatory Visit | Attending: Obstetrics and Gynecology | Admitting: Obstetrics and Gynecology

## 2023-05-17 DIAGNOSIS — Z1231 Encounter for screening mammogram for malignant neoplasm of breast: Secondary | ICD-10-CM | POA: Insufficient documentation

## 2023-05-28 ENCOUNTER — Ambulatory Visit: Payer: Self-pay | Admitting: Surgery

## 2023-05-28 NOTE — H&P (Signed)
 Subjective:  CC: Gluteal abscess [L02.31]   HPI:  Linda Roach is a 53 y.o. female who was referrred by Mechele Collin, NP for above. Symptoms were first noted 2 months ago. Fluctuating size between ping pong and quarter size. Unsure of any drainage,  painful when large. Warm compress may have helped last time.    Past Medical History:  has a past medical history of COVID-19 (06/17/2019), Diabetic retinopathy associated with type 1 diabetes mellitus (CMS/HHS-HCC), Family history of breast cancer, GERD (gastroesophageal reflux disease), Hyperlipidemia, Hypertension, LADA (latent autoimmune diabetes in adults), managed as type 1 (CMS/HHS-HCC), Leiomyoma of uterus, and Stress incontinence (2000).  Past Surgical History:  has a past surgical history that includes PR EYE SURG POST SGMT PROC UNLISTED; PR TRABECULOPLASTY BY LASER SURGERY; VITRECTOMY, MECHANICAL, PARS PLANA APPROACH; WITH ENDOLASER PANRETINAL PHOTOCOAGULATION (Left, 10/05/2020); VITRECTOMY, MECHANICAL, PARS PLANA APPROACH; WITH ENDOLASER PANRETINAL PHOTOCOAGULATION (Right, 01/25/2021); Tonsillectomy (02/2013); Appendectomy (2013); Bladder surgery (2013); IUD INSERTION (06/08/2010); and Colonoscopy (01/23/2022).  Family History: family history includes Arthritis in her brother; Breast cancer in her paternal grandmother; Colon cancer in her maternal grandmother; Coronary Artery Disease (Blocked arteries around heart) in her father, maternal grandmother, and paternal grandmother; Diabetes in her brother and mother; Diabetes type II in her father, maternal grandmother, and paternal grandmother; High blood pressure (Hypertension) in her brother; Hyperlipidemia (Elevated cholesterol) in her brother; Lupus in her daughter; Macular degeneration in her father; Prostate cancer in her maternal grandfather and paternal grandfather; Stroke in her maternal grandmother; Thyroid disease in her daughter.  Social History:  reports that she has never  smoked. She has never used smokeless tobacco. She reports current alcohol use of about 4.0 standard drinks of alcohol per week. She reports that she does not use drugs.  Current Medications: has a current medication list which includes the following prescription(s): acetaminophen, basaglar kwikpen u-100 insulin, blood glucose diagnostic, cholecalciferol, cyanocobalamin, jardiance, freestyle libre 14 day sensor, herbal supplement, levonorgestrel, lisinopril, nitrofurantoin (macrocrystal-monohydrate), omeprazole, phenazopyridine, semaglutide, and valacyclovir.  Allergies:  Allergies as of 05/28/2023 - Reviewed 05/28/2023  Allergen Reaction Noted   Kiwi Other (See Comments) 11/19/2011    ROS:  A 15 point review of systems was performed and pertinent positives and negatives noted in HPI  Objective:     BP 135/87   Pulse 83   Ht 165.1 cm (5\' 5" )   Wt 78 kg (172 lb)   BMI 28.62 kg/m   Constitutional :  No distress, cooperative, alert  Lymphatics/Throat:  Supple with no lymphadenopathy  Respiratory:  Clear to auscultation bilaterally  Cardiovascular:  Regular rate and rhythm  Gastrointestinal: Soft, non-tender, non-distended, no organomegaly.  Musculoskeletal: Steady gait and movement  Skin: Cool and moist  Psychiatric: Normal affect, non-agitated, not confused  Rectal: Chaperone present for exam.       LABS:  N/a   RADS:  Assessment:     Gluteal abscess [L02.31]  Plan:    1. Gluteal abscess [L02.31] Discussed risks/benefits/alternatives to surgery.  Alternatives include the options of observation, medical management.  Benefits include symptomatic relief.  I discussed  in detail and the complications related to the operation and the anesthesia, including bleeding, infection, recurrence, remote possibility of temporary or permanent fecal incontinence, poor/delayed wound healing, chronic pain, and additional procedures to address said risks. The risks of general anesthetic, if  used, includes MI, CVA, sudden death or even reaction to anesthetic medications also discussed.   We also discussed typical post operative recovery which includes weeks to  potentially months of anal pain, drainage, occasional bleeding, and sense of fecal urgency.    ED return precautions given for sudden increase in pain, bleeding, with possible accompanying fever, nausea, and/or vomiting.  The patient understands the risks, any and all questions were answered to the patient's satisfaction.  2. Patient has elected to proceed with surgical treatment. Procedure will be scheduled. possible caused by a fistula so will proceed with exam under anesthesia, possible seton placement 16109, [id number]  labs/images/medications/previous chart entries reviewed personally and relevant changes/updates noted above.

## 2023-06-26 ENCOUNTER — Other Ambulatory Visit

## 2023-07-24 ENCOUNTER — Other Ambulatory Visit: Payer: Self-pay

## 2023-07-24 ENCOUNTER — Encounter
Admission: RE | Admit: 2023-07-24 | Discharge: 2023-07-24 | Disposition: A | Discharge status: Home / Self Care | Source: Ambulatory Visit | Attending: Surgery | Admitting: Surgery

## 2023-07-24 VITALS — Ht 65.0 in | Wt 167.0 lb

## 2023-07-24 DIAGNOSIS — Z01812 Encounter for preprocedural laboratory examination: Secondary | ICD-10-CM

## 2023-07-24 DIAGNOSIS — I10 Essential (primary) hypertension: Secondary | ICD-10-CM

## 2023-07-24 DIAGNOSIS — E119 Type 2 diabetes mellitus without complications: Secondary | ICD-10-CM

## 2023-07-24 HISTORY — DX: Menopausal and female climacteric states: N95.1

## 2023-07-24 HISTORY — DX: Type 2 diabetes mellitus with unspecified complications: E11.8

## 2023-07-24 HISTORY — DX: Gastro-esophageal reflux disease without esophagitis: K21.9

## 2023-07-24 HISTORY — DX: Essential (primary) hypertension: I10

## 2023-07-24 HISTORY — DX: Other specified abnormal findings of blood chemistry: R79.89

## 2023-07-24 HISTORY — DX: Mixed hyperlipidemia: E78.2

## 2023-07-24 NOTE — Patient Instructions (Addendum)
 Your procedure is scheduled on: Friday 08/02/23 Report to the Registration Desk on the 1st floor of the [facility]. To find out your arrival time, please call [phone] between 1PM - 3PM on: Thursday 08/01/23  If your arrival time is 6:00 am, do not arrive before that time as the [facility] entrance doors do not open until 6:00 am.  REMEMBER: Instructions that are not followed completely may result in serious medical risk, up to and including death; or upon the discretion of your surgeon and anesthesiologist your surgery may need to be rescheduled.  Do not eat food after midnight the night before surgery.  No gum chewing or hard candies.  You may however, drink CLEAR liquids up to 2 hours before you are scheduled to arrive for your surgery. Do not drink anything within 2 hours of your scheduled arrival time.  Clear liquids include: - water  Do NOT drink anything that is not on this list.   One week prior to surgery: Stop Anti-inflammatories (NSAIDS) such as Advil, Aleve, Ibuprofen, Motrin, Naproxen, Naprosyn and Aspirin  based products such as Excedrin, Goody's Powder, BC Powder. Stop ANY OVER THE COUNTER supplements until after surgery.  You may however, continue to take Tylenol if needed for pain up until the day of surgery.  Stop Semaglutide, 2 MG/DOSE, (OZEMPIC, 2 MG/DOSE,) 8 MG/3ML one week prior to surgery.  Stop JARDIANCE 25 MG 3 days prior to surgery (take last dose Monday 07/29/23) Take 1/2 of your normal dose of Insulin  Glargine (BASAGLAR  KWIKPEN) 100 UNIT/ML the night before your surgery, NO Insulin  the morning of your surgery.   Continue taking all of your other prescription medications up until the day of surgery.  ON THE DAY OF SURGERY ONLY TAKE THESE MEDICATIONS WITH SIPS OF WATER:  omeprazole (PRILOSEC) 20 MG    No Alcohol for 24 hours before or after surgery.  No Smoking including e-cigarettes for 24 hours before surgery.  No chewable tobacco products for at  least 6 hours before surgery.  No nicotine patches on the day of surgery.  Do not use any "recreational" drugs for at least a week (preferably 2 weeks) before your surgery.  Please be advised that the combination of cocaine and anesthesia may have negative outcomes, up to and including death. If you test positive for cocaine, your surgery will be cancelled.  On the morning of surgery brush your teeth with toothpaste and water, you may rinse your mouth with mouthwash if you wish. Do not swallow any toothpaste or mouthwash.  Use CHG Soap or wipes as directed on instruction sheet.  Do not wear jewelry, make-up, hairpins, clips or nail polish.  For welded (permanent) jewelry: bracelets, anklets, waist bands, etc.  Please have this removed prior to surgery.  If it is not removed, there is a chance that hospital personnel will need to cut it off on the day of surgery.  Do not wear lotions, powders, or perfumes.   Do not shave body hair from the neck down 48 hours before surgery.  Contact lenses, hearing aids and dentures may not be worn into surgery.  Do not bring valuables to the hospital. [facility] is not responsible for any missing/lost belongings or valuables.   Notify your doctor if there is any change in your medical condition (cold, fever, infection).  Wear comfortable clothing (specific to your surgery type) to the hospital.  After surgery, you can help prevent lung complications by doing breathing exercises.  Take deep breaths and  cough every 1-2 hours. Your doctor may order a device called an Incentive Spirometer to help you take deep breaths. When coughing or sneezing, hold a pillow firmly against your incision with both hands. This is called "splinting." Doing this helps protect your incision. It also decreases belly discomfort.  If you are being admitted to the hospital overnight, leave your suitcase in the car. After surgery it may be brought to your room.  In case of  increased patient census, it may be necessary for you, the patient, to continue your postoperative care in the Same Day Surgery department.  If you are being discharged the day of surgery, you will not be allowed to drive home. You will need a responsible individual to drive you home and stay with you for 24 hours after surgery.   If you are taking public transportation, you will need to have a responsible individual with you.  Please call the Pre-admissions Testing Dept. at [phone] if you have any questions about these instructions.  Surgery Visitation Policy:  Patients having surgery or a procedure may have two visitors.  Children under the age of 25 must have an adult with them who is not the patient.  Inpatient Visitation:    Visiting hours are 7 a.m. to 8 p.m. Up to four visitors are allowed at one time in a patient room. The visitors may rotate out with other people during the day.  One visitor age 63 or older may stay with the patient overnight and must be in the room by 8 p.m.     Preparing for Surgery with CHLORHEXIDINE GLUCONATE (CHG) Soap  Chlorhexidine Gluconate (CHG) Soap  o An antiseptic cleaner that kills germs and bonds with the skin to continue killing germs even after washing  o Used for showering the night before surgery and morning of surgery  Before surgery, you can play an important role by reducing the number of germs on your skin.  CHG (Chlorhexidine gluconate) soap is an antiseptic cleanser which kills germs and bonds with the skin to continue killing germs even after washing.  Please do not use if you have an allergy to CHG or antibacterial soaps. If your skin becomes reddened/irritated stop using the CHG.  1. Shower the NIGHT BEFORE SURGERY and the MORNING OF SURGERY with CHG soap.  2. If you choose to wash your hair, wash your hair first as usual with your normal shampoo.  3. After shampooing, rinse your hair and body thoroughly to remove the  shampoo.  4. Use CHG as you would any other liquid soap. You can apply CHG directly to the skin and wash gently with a scrungie or a clean washcloth.  5. Apply the CHG soap to your body only from the neck down. Do not use on open wounds or open sores. Avoid contact with your eyes, ears, mouth, and genitals (private parts). Wash face and genitals (private parts) with your normal soap.  6. Wash thoroughly, paying special attention to the area where your surgery will be performed.  7. Thoroughly rinse your body with warm water.  8. Do not shower/wash with your normal soap after using and rinsing off the CHG soap.  9. Pat yourself dry with a clean towel.  10. Wear clean pajamas to bed the night before surgery.  12. Place clean sheets on your bed the night of your first shower and do not sleep with pets.  13. Shower again with the CHG soap on the day of surgery  prior to arriving at the hospital.  14. Do not apply any deodorants/lotions/powders.  15. Please wear clean clothes to the hospital.

## 2023-07-25 ENCOUNTER — Encounter
Admission: RE | Admit: 2023-07-25 | Discharge: 2023-07-25 | Disposition: A | Discharge status: Home / Self Care | Source: Ambulatory Visit | Attending: Surgery | Admitting: Surgery

## 2023-07-25 DIAGNOSIS — I10 Essential (primary) hypertension: Secondary | ICD-10-CM | POA: Diagnosis not present

## 2023-07-25 DIAGNOSIS — Z794 Long term (current) use of insulin: Secondary | ICD-10-CM | POA: Insufficient documentation

## 2023-07-25 DIAGNOSIS — E119 Type 2 diabetes mellitus without complications: Secondary | ICD-10-CM | POA: Insufficient documentation

## 2023-07-25 DIAGNOSIS — Z01818 Encounter for other preprocedural examination: Secondary | ICD-10-CM | POA: Diagnosis present

## 2023-07-25 DIAGNOSIS — Z01810 Encounter for preprocedural cardiovascular examination: Secondary | ICD-10-CM | POA: Diagnosis not present

## 2023-07-25 LAB — BASIC METABOLIC PANEL WITH GFR
Anion gap: 3 — ABNORMAL LOW (ref 5–15)
BUN: 17 mg/dL (ref 6–20)
CO2: 23 mmol/L (ref 22–32)
Calcium: 8.8 mg/dL — ABNORMAL LOW (ref 8.9–10.3)
Chloride: 106 mmol/L (ref 98–111)
Creatinine, Ser: 0.72 mg/dL (ref 0.44–1.00)
GFR, Estimated: >60 mL/min (ref >60)
Glucose, Bld: 342 mg/dL — ABNORMAL HIGH (ref 70–99)
Potassium: 4.2 mmol/L (ref 3.5–5.1)
Sodium: 132 mmol/L — ABNORMAL LOW (ref 135–145)

## 2023-07-25 LAB — CBC
HCT: 37.7 % (ref 36.0–46.0)
Hemoglobin: 12.9 g/dL (ref 12.0–15.0)
MCH: 30.4 pg (ref 26.0–34.0)
MCHC: 34.2 g/dL (ref 30.0–36.0)
MCV: 88.9 fL (ref 80.0–100.0)
Platelets: 216 10*3/uL (ref 150–400)
RBC: 4.24 MIL/uL (ref 3.87–5.11)
RDW: 12.3 % (ref 11.5–15.5)
WBC: 4.9 10*3/uL (ref 4.0–10.5)
nRBC: 0 % (ref 0.0–0.2)

## 2023-07-25 LAB — HEMOGLOBIN A1C
Hgb A1c MFr Bld: 8.7 % — ABNORMAL HIGH (ref 4.8–5.6)
Mean Plasma Glucose: 202.99 mg/dL

## 2023-08-01 MED ORDER — CHLORHEXIDINE GLUCONATE 0.12 % MT SOLN
15.0000 mL | Freq: Once | OROMUCOSAL | Status: AC
  Administered 2023-08-02: 15 mL via OROMUCOSAL

## 2023-08-01 MED ORDER — ORAL CARE MOUTH RINSE: 15.0000 mL | Freq: Once | OROMUCOSAL | Status: AC

## 2023-08-01 MED ORDER — CHLORHEXIDINE GLUCONATE CLOTH 2 % EX PADS: 6.0000 | MEDICATED_PAD | Freq: Once | CUTANEOUS | Status: DC

## 2023-08-01 MED ORDER — CEFAZOLIN SODIUM-DEXTROSE 2-4 GM/100ML-% IV SOLN
2.0000 g | INTRAVENOUS | Status: AC
  Administered 2023-08-02: 2 g via INTRAVENOUS

## 2023-08-01 MED ORDER — SODIUM CHLORIDE 0.9 % IV SOLN: INTRAVENOUS | Status: DC

## 2023-08-02 ENCOUNTER — Ambulatory Visit: Payer: Self-pay | Admitting: Urgent Care

## 2023-08-02 ENCOUNTER — Ambulatory Visit: Admitting: Registered Nurse

## 2023-08-02 ENCOUNTER — Ambulatory Visit
Admission: RE | Admit: 2023-08-02 | Discharge: 2023-08-02 | Disposition: A | Discharge status: Home / Self Care | Attending: Surgery | Admitting: Surgery

## 2023-08-02 ENCOUNTER — Other Ambulatory Visit: Payer: Self-pay

## 2023-08-02 ENCOUNTER — Encounter
Admission: RE | Disposition: A | Discharge status: Home / Self Care | Payer: Self-pay | Source: Home / Self Care | Attending: Surgery

## 2023-08-02 ENCOUNTER — Encounter: Payer: Self-pay | Admitting: Surgery

## 2023-08-02 DIAGNOSIS — Z01812 Encounter for preprocedural laboratory examination: Secondary | ICD-10-CM

## 2023-08-02 DIAGNOSIS — Z7985 Long-term (current) use of injectable non-insulin antidiabetic drugs: Secondary | ICD-10-CM | POA: Insufficient documentation

## 2023-08-02 DIAGNOSIS — Z794 Long term (current) use of insulin: Secondary | ICD-10-CM | POA: Diagnosis not present

## 2023-08-02 DIAGNOSIS — I10 Essential (primary) hypertension: Secondary | ICD-10-CM | POA: Insufficient documentation

## 2023-08-02 DIAGNOSIS — L720 Epidermal cyst: Secondary | ICD-10-CM

## 2023-08-02 DIAGNOSIS — L0231 Cutaneous abscess of buttock: Secondary | ICD-10-CM | POA: Insufficient documentation

## 2023-08-02 DIAGNOSIS — E10319 Type 1 diabetes mellitus with unspecified diabetic retinopathy without macular edema: Secondary | ICD-10-CM | POA: Diagnosis not present

## 2023-08-02 DIAGNOSIS — K219 Gastro-esophageal reflux disease without esophagitis: Secondary | ICD-10-CM | POA: Diagnosis not present

## 2023-08-02 DIAGNOSIS — E119 Type 2 diabetes mellitus without complications: Secondary | ICD-10-CM

## 2023-08-02 DIAGNOSIS — Z7984 Long term (current) use of oral hypoglycemic drugs: Secondary | ICD-10-CM | POA: Diagnosis not present

## 2023-08-02 HISTORY — PX: INCISION AND DRAINAGE ABSCESS: SHX5864

## 2023-08-02 HISTORY — PX: RECTAL EXAM UNDER ANESTHESIA: SHX6399

## 2023-08-02 LAB — GLUCOSE, CAPILLARY
Glucose-Capillary: 149 mg/dL — ABNORMAL HIGH (ref 70–99)
Glucose-Capillary: 170 mg/dL — ABNORMAL HIGH (ref 70–99)

## 2023-08-02 LAB — POCT PREGNANCY, URINE: Preg Test, Ur: NEGATIVE

## 2023-08-02 SURGERY — EXAM UNDER ANESTHESIA, RECTUM
Anesthesia: General | Site: Rectum

## 2023-08-02 MED ORDER — GELATIN ABSORBABLE 12-7 MM EX MISC
CUTANEOUS | Status: AC
  Filled 2023-08-02: qty 1

## 2023-08-02 MED ORDER — PHENYLEPHRINE 80 MCG/ML (10ML) SYRINGE FOR IV PUSH (FOR BLOOD PRESSURE SUPPORT)
PREFILLED_SYRINGE | INTRAVENOUS | Status: DC | PRN
  Administered 2023-08-02: 80 ug via INTRAVENOUS
  Administered 2023-08-02 (×2): 160 ug via INTRAVENOUS

## 2023-08-02 MED ORDER — CHLORHEXIDINE GLUCONATE 0.12 % MT SOLN
OROMUCOSAL | Status: AC
Start: 2023-08-02 — End: ?
  Filled 2023-08-02: qty 15

## 2023-08-02 MED ORDER — FENTANYL CITRATE (PF) 100 MCG/2ML IJ SOLN
INTRAMUSCULAR | Status: AC
  Filled 2023-08-02: qty 2

## 2023-08-02 MED ORDER — DOCUSATE SODIUM 100 MG PO CAPS: 100.0000 mg | ORAL_CAPSULE | Freq: Two times a day (BID) | ORAL | 0 refills | Status: AC | PRN

## 2023-08-02 MED ORDER — BUPIVACAINE-EPINEPHRINE (PF) 0.5% -1:200000 IJ SOLN
INTRAMUSCULAR | Status: DC | PRN
  Administered 2023-08-02: 20 mL

## 2023-08-02 MED ORDER — DEXAMETHASONE SODIUM PHOSPHATE 10 MG/ML IJ SOLN
INTRAMUSCULAR | Status: AC
  Filled 2023-08-02: qty 1

## 2023-08-02 MED ORDER — ONDANSETRON HCL 4 MG/2ML IJ SOLN
INTRAMUSCULAR | Status: DC | PRN
  Administered 2023-08-02: 4 mg via INTRAVENOUS

## 2023-08-02 MED ORDER — PROPOFOL 10 MG/ML IV BOLUS
INTRAVENOUS | Status: AC
  Filled 2023-08-02: qty 20

## 2023-08-02 MED ORDER — ONDANSETRON HCL 4 MG/2ML IJ SOLN
INTRAMUSCULAR | Status: AC
  Filled 2023-08-02: qty 2

## 2023-08-02 MED ORDER — IBUPROFEN 800 MG PO TABS: 800.0000 mg | ORAL_TABLET | Freq: Three times a day (TID) | ORAL | 0 refills | Status: AC | PRN

## 2023-08-02 MED ORDER — BUPIVACAINE LIPOSOME 1.3 % IJ SUSP
INTRAMUSCULAR | Status: AC
  Filled 2023-08-02: qty 20

## 2023-08-02 MED ORDER — LIDOCAINE HCL (PF) 2 % IJ SOLN
INTRAMUSCULAR | Status: AC
  Filled 2023-08-02: qty 5

## 2023-08-02 MED ORDER — LIDOCAINE HCL (CARDIAC) PF 100 MG/5ML IV SOSY
PREFILLED_SYRINGE | INTRAVENOUS | Status: DC | PRN
  Administered 2023-08-02: 50 mg via INTRAVENOUS

## 2023-08-02 MED ORDER — BUPIVACAINE-EPINEPHRINE (PF) 0.5% -1:200000 IJ SOLN
INTRAMUSCULAR | Status: AC
  Filled 2023-08-02: qty 30

## 2023-08-02 MED ORDER — PROPOFOL 10 MG/ML IV BOLUS
INTRAVENOUS | Status: DC | PRN
  Administered 2023-08-02: 140 mg via INTRAVENOUS

## 2023-08-02 MED ORDER — FENTANYL CITRATE (PF) 100 MCG/2ML IJ SOLN
INTRAMUSCULAR | Status: DC | PRN
  Administered 2023-08-02: 50 ug via INTRAVENOUS

## 2023-08-02 MED ORDER — MIDAZOLAM HCL 2 MG/2ML IJ SOLN
INTRAMUSCULAR | Status: AC
  Filled 2023-08-02: qty 2

## 2023-08-02 MED ORDER — 0.9 % SODIUM CHLORIDE (POUR BTL) OPTIME
TOPICAL | Status: DC | PRN
  Administered 2023-08-02: 500 mL

## 2023-08-02 MED ORDER — MIDAZOLAM HCL 2 MG/2ML IJ SOLN
INTRAMUSCULAR | Status: DC | PRN
  Administered 2023-08-02: 2 mg via INTRAVENOUS

## 2023-08-02 MED ORDER — DEXAMETHASONE SODIUM PHOSPHATE 10 MG/ML IJ SOLN
INTRAMUSCULAR | Status: DC | PRN
  Administered 2023-08-02: 10 mg via INTRAVENOUS

## 2023-08-02 MED ORDER — ACETAMINOPHEN 10 MG/ML IV SOLN
INTRAVENOUS | Status: AC
  Filled 2023-08-02: qty 100

## 2023-08-02 MED ORDER — OXYCODONE-ACETAMINOPHEN 5-325 MG PO TABS
1.0000 | ORAL_TABLET | Freq: Three times a day (TID) | ORAL | 0 refills | Status: AC | PRN
Start: 2023-08-02 — End: 2024-08-01

## 2023-08-02 MED ORDER — ACETAMINOPHEN 10 MG/ML IV SOLN
INTRAVENOUS | Status: DC | PRN
  Administered 2023-08-02: 1000 mg via INTRAVENOUS

## 2023-08-02 MED ORDER — CEFAZOLIN SODIUM-DEXTROSE 2-4 GM/100ML-% IV SOLN
INTRAVENOUS | Status: AC
  Filled 2023-08-02: qty 100

## 2023-08-02 SURGICAL SUPPLY — 30 items
BLADE SURG 15 STRL LF DISP TIS (BLADE) ×2 IMPLANT
BRIEF MESH DISP 2XL (UNDERPADS AND DIAPERS) ×2 IMPLANT
DRAPE PERI LITHO V/GYN (MISCELLANEOUS) ×2 IMPLANT
DRAPE UNDER BUTTOCK W/FLU (DRAPES) ×2 IMPLANT
DRSG GAUZE FLUFF 36X18 (GAUZE/BANDAGES/DRESSINGS) ×1 IMPLANT
DRSG MEPILEX FLEX 3X3 (GAUZE/BANDAGES/DRESSINGS) ×1 IMPLANT
ELECTRODE REM PT RTRN 9FT ADLT (ELECTROSURGICAL) ×2 IMPLANT
GAUZE 4X4 16PLY ~~LOC~~+RFID DBL (SPONGE) IMPLANT
GAUZE PACKING IODOFORM 1/2INX (GAUZE/BANDAGES/DRESSINGS) ×1 IMPLANT
GLOVE BIOGEL PI IND STRL 7.0 (GLOVE) ×4 IMPLANT
GLOVE SURG SYN 6.5 ES PF (GLOVE) ×6 IMPLANT
GLOVE SURG SYN 6.5 PF PI (GLOVE) ×1 IMPLANT
GOWN STRL REUS W/ TWL LRG LVL3 (GOWN DISPOSABLE) ×5 IMPLANT
KIT TURNOVER CYSTO (KITS) ×2 IMPLANT
LABEL OR SOLS (LABEL) ×2 IMPLANT
MANIFOLD NEPTUNE II (INSTRUMENTS) ×2 IMPLANT
NDL HYPO 22X1.5 SAFETY MO (MISCELLANEOUS) ×1 IMPLANT
NEEDLE HYPO 22X1.5 SAFETY MO (MISCELLANEOUS) ×2 IMPLANT
NS IRRIG 500ML POUR BTL (IV SOLUTION) ×2 IMPLANT
PACK BASIN MINOR ARMC (MISCELLANEOUS) ×2 IMPLANT
PAD PREP OB/GYN DISP 24X41 (PERSONAL CARE ITEMS) ×2 IMPLANT
SHEARS HARMONIC 9CM CVD (BLADE) IMPLANT
SOLUTION PREP PVP 2OZ (MISCELLANEOUS) ×2 IMPLANT
SURGILUBE 2OZ TUBE FLIPTOP (MISCELLANEOUS) ×2 IMPLANT
SUT SILK 2 0 SH (SUTURE) ×1 IMPLANT
SUT VIC AB 3-0 SH 27X BRD (SUTURE) ×2 IMPLANT
SWAB CULTURE AMIES ANAERIB BLU (MISCELLANEOUS) IMPLANT
SYR 10ML LL (SYRINGE) ×2 IMPLANT
SYR 20ML LL LF (SYRINGE) ×2 IMPLANT
TRAP FLUID SMOKE EVACUATOR (MISCELLANEOUS) ×2 IMPLANT

## 2023-08-02 NOTE — Progress Notes (Signed)
 Per Dr. Rosea Conch, patient is okay to be out of work until May 23rd

## 2023-08-02 NOTE — Transfer of Care (Signed)
 Immediate Anesthesia Transfer of Care Note  Patient: Linda Roach  Procedure(s) Performed: EXAM UNDER ANESTHESIA, RECTUM (Rectum) INCISION AND DRAINAGE, ABSCESS (Rectum)  Patient Location: PACU  Anesthesia Type:General  Level of Consciousness: drowsy and patient cooperative  Airway & Oxygen Therapy: Patient Spontanous Breathing and Patient connected to nasal cannula oxygen  Post-op Assessment: Report given to RN and Post -op Vital signs reviewed and stable  Post vital signs: Reviewed and stable  Last Vitals:  Vitals Value Taken Time  BP 129/68 08/02/23 0855  Temp    Pulse 90 08/02/23 0855  Resp 15 08/02/23 0855  SpO2 100 % 08/02/23 0855  Vitals shown include unfiled device data.  Last Pain:  Vitals:   08/02/23 0855  TempSrc: Tympanic  PainSc: 0-No pain         Complications: No notable events documented.

## 2023-08-02 NOTE — H&P (Signed)
 Subjective:  CC: Gluteal abscess [L02.31]   HPI: Linda Roach is a 53 y.o. female who was referrred by Judyth Nunnery, NP for above. Symptoms were first noted 2 months ago. Fluctuating size between ping pong and quarter size. Unsure of any drainage, painful when large. Warm compress may have helped last time.   Past Medical History: has a past medical history of COVID-19 (06/17/2019), Diabetic retinopathy associated with type 1 diabetes mellitus (CMS/HHS-HCC), Family history of breast cancer, GERD (gastroesophageal reflux disease), Hyperlipidemia, Hypertension, LADA (latent autoimmune diabetes in adults), managed as type 1 (CMS/HHS-HCC), Leiomyoma of uterus, and Stress incontinence (2000).  Past Surgical History: has a past surgical history that includes PR EYE SURG POST SGMT PROC UNLISTED; PR TRABECULOPLASTY BY LASER SURGERY; VITRECTOMY, MECHANICAL, PARS PLANA APPROACH; WITH ENDOLASER PANRETINAL PHOTOCOAGULATION (Left, 10/05/2020); VITRECTOMY, MECHANICAL, PARS PLANA APPROACH; WITH ENDOLASER PANRETINAL PHOTOCOAGULATION (Right, 01/25/2021); Tonsillectomy (02/2013); Appendectomy (2013); Bladder surgery (2013); IUD INSERTION (06/08/2010); and Colonoscopy (01/23/2022).  Family History: family history includes Arthritis in her brother; Breast cancer in her paternal grandmother; Colon cancer in her maternal grandmother; Coronary Artery Disease (Blocked arteries around heart) in her father, maternal grandmother, and paternal grandmother; Diabetes in her brother and mother; Diabetes type II in her father, maternal grandmother, and paternal grandmother; High blood pressure (Hypertension) in her brother; Hyperlipidemia (Elevated cholesterol) in her brother; Lupus in her daughter; Macular degeneration in her father; Prostate cancer in her maternal grandfather and paternal grandfather; Stroke in her maternal grandmother; Thyroid disease in her daughter.  Social History: reports that she has never smoked.  She has never used smokeless tobacco. She reports current alcohol use of about 4.0 standard drinks of alcohol per week. She reports that she does not use drugs.  Current Medications: has a current medication list which includes the following prescription(s): acetaminophen, basaglar  kwikpen u-100 insulin , blood glucose diagnostic, cholecalciferol, cyanocobalamin, jardiance, freestyle libre 14 day sensor, herbal supplement, levonorgestrel , lisinopril , nitrofurantoin  (macrocrystal-monohydrate), omeprazole, phenazopyridine, semaglutide, and valacyclovir .  Allergies:  Allergies as of 05/28/2023 - Reviewed 05/28/2023  Allergen Reaction Noted  Kiwi Other (See Comments) 11/19/2011   ROS:  A 15 point review of systems was performed and pertinent positives and negatives noted in HPI  Objective:    BP 135/87  Pulse 83  Ht 165.1 cm (5\' 5" )  Wt 78 kg (172 lb)  BMI 28.62 kg/m   Constitutional : No distress, cooperative, alert  Lymphatics/Throat: Supple with no lymphadenopathy  Respiratory: Clear to auscultation bilaterally  Cardiovascular: Regular rate and rhythm  Gastrointestinal: Soft, non-tender, non-distended, no organomegaly.  Musculoskeletal: Steady gait and movement  Skin: Cool and moist  Psychiatric: Normal affect, non-agitated, not confused  Rectal: Chaperone present for exam.    LABS:  N/a   RADS:  Assessment:    Gluteal abscess [L02.31]  Plan:   1. Gluteal abscess [L02.31] Discussed risks/benefits/alternatives to surgery. Alternatives include the options of observation, medical management. Benefits include symptomatic relief. I discussed in detail and the complications related to the operation and the anesthesia, including bleeding, infection, recurrence, remote possibility of temporary or permanent fecal incontinence, poor/delayed wound healing, chronic pain, and additional procedures to address said risks. The risks of general anesthetic, if used, includes MI, CVA, sudden  death or even reaction to anesthetic medications also discussed.  We also discussed typical post operative recovery which includes weeks to potentially months of anal pain, drainage, occasional bleeding, and sense of fecal urgency.   ED return precautions given for sudden increase in pain, bleeding, with possible  accompanying fever, nausea, and/or vomiting.  The patient understands the risks, any and all questions were answered to the patient's satisfaction.  2. Patient has elected to proceed with surgical treatment. Procedure will be scheduled. possible caused by a fistula so will proceed with exam under anesthesia, possible seton placement 16109, [phone]

## 2023-08-02 NOTE — Op Note (Signed)
 Preoperative diagnosis: Anal fistula Postoperative diagnosis: Gluteal abscess  Procedure: Exam under anesthesia, incision and drainage of gluteal abscess  Anesthesia: LMA  Surgeon: Rosea Conch  Wound Classification: Clean Contaminated  Specimen: None  Complications: None  EBL: 3 mL  Indications:  Patient is a 53 y.o. female with clinical exam concerning for an anal fistula.  Here today for formal exam under anesthesia.  See H&P for further details.  Description of procedure: The patient was taken to the operating room and placed in high lithotomy position.  LMA anesthesia was induced without any difficulty. A time-out was completed verifying correct patient, procedure, site, positioning, and implant(s) and/or special equipment prior to beginning this procedure.  Digital rectal exam noted no obvious indurated tissue or palpable fistulous tracts within the rectum. Visualization within the lumen shortly afterwards confirmed the above findings.    The area of constant drainage in the 5 o'clock position approximately 5 to 6 cm away from the anal verge had minimal induration today with no obvious discharge.  After infusing local anesthesia surrounding the abscess site a 15 blade was used to open the area.  Drops of abscess was noted within upon incision, and further inspection of the area did note chronically inflamed tissue which was debrided sharply until healthy subcutaneous fat was noted.  The wound in the end measured approximately 1/2 cm x 3 cm, with some extension away from the anal verge towards the sacrum.  Further inspection of the wound base did not reveal any concerns for a fistula.  Wound was irrigated packed with half-inch iodoform packing, covered with Mepilex.   The patient tolerated the procedure well and  taken to the postoperative care unit in stable condition.  Sponge and instrument count was correct at the end of the procedure.

## 2023-08-02 NOTE — Discharge Instructions (Signed)
 Removal, Care After This sheet gives you information about how to care for yourself after your procedure. Your health care provider may also give you more specific instructions. If you have problems or questions, contact your health care provider. What can I expect after the procedure? After the procedure, it is common to have: Soreness. Bruising. Itching. Follow these instructions at home: site care Please keep dressing intact for at least 48hrs if possible.  If dressing and packing falls out before then, leave it out and no need to recover it not able to.  AFTER 48HRS, please remove all dressing and packing if still intact and ok to shower.  Recover area if possible, but if not, ok to leave open.  Check your site every day for signs of infection. Check for: Redness, swelling, or pain. Fluid or blood. Warmth. Pus or a bad smell.  General instructions Rest and then return to your normal activities as told by your health care provider.  tylenol and advil as needed for discomfort.  Please alternate between the two every four hours as needed for pain.    Use narcotics, if prescribed, only when tylenol and motrin is not enough to control pain.  325-650mg  every 8hrs to max of 3000mg /24hrs (including the 325mg  in every norco dose) for the tylenol.    Advil up to 800mg  per dose every 8hrs as needed for pain.   Keep all follow-up visits as told by your health care provider. This is important. Contact a health care provider if: You have redness, swelling, or pain around your site. You have fluid or blood coming from your site. Your site feels warm to the touch. You have pus or a bad smell coming from your site. You have a fever. Your sutures, skin glue, or adhesive strips loosen or come off sooner than expected. Get help right away if: You have bleeding that does not stop with pressure or a dressing. Summary After the procedure, it is common to have some soreness, bruising, and itching at  the site. Follow instructions from your health care provider about how to take care of your site. Check your site every day for signs of infection. Contact a health care provider if you have redness, swelling, or pain around your site, or your site feels warm to the touch. Keep all follow-up visits as told by your health care provider. This is important. This information is not intended to replace advice given to you by your health care provider. Make sure you discuss any questions you have with your health care provider. Document Released: 04/08/2015 Document Revised: 09/09/2017 Document Reviewed: 09/09/2017 Elsevier Interactive Patient Education  [employer].

## 2023-08-02 NOTE — Interval H&P Note (Signed)
 No change. OK to proceed.

## 2023-08-02 NOTE — Anesthesia Preprocedure Evaluation (Signed)
 Anesthesia Evaluation  Patient identified by MRN, date of birth, ID band Patient awake    Reviewed: Allergy & Precautions, H&P , NPO status , Patient's Chart, lab work & pertinent test results, reviewed documented beta blocker date and time   Airway Mallampati: II  TM Distance: >3 FB Neck ROM: full    Dental  (+) Teeth Intact   Pulmonary neg pulmonary ROS   Pulmonary exam normal        Cardiovascular Exercise Tolerance: Good hypertension, On Medications negative cardio ROS Normal cardiovascular exam Rate:Normal     Neuro/Psych negative neurological ROS  negative psych ROS   GI/Hepatic Neg liver ROS,GERD  Medicated,,  Endo/Other  negative endocrine ROSdiabetes, Well Controlled    Renal/GU negative Renal ROS  negative genitourinary   Musculoskeletal   Abdominal   Peds  Hematology negative hematology ROS (+)   Anesthesia Other Findings   Reproductive/Obstetrics negative OB ROS                             Anesthesia Physical Anesthesia Plan  ASA: 2  Anesthesia Plan: General LMA   Post-op Pain Management:    Induction:   PONV Risk Score and Plan:   Airway Management Planned:   Additional Equipment:   Intra-op Plan:   Post-operative Plan:   Informed Consent: I have reviewed the patients History and Physical, chart, labs and discussed the procedure including the risks, benefits and alternatives for the proposed anesthesia with the patient or authorized representative who has indicated his/her understanding and acceptance.       Plan Discussed with: CRNA  Anesthesia Plan Comments:        Anesthesia Quick Evaluation

## 2023-08-02 NOTE — Interval H&P Note (Signed)
 History and Physical Interval Note:  08/02/2023 7:11 AM  [name]  has presented today for surgery, with the diagnosis of L02.31  Gluteal abscess.  The various methods of treatment have been discussed with the patient and family. After consideration of risks, benefits and other options for treatment, the patient has consented to  Procedure(s) with comments: EXAM UNDER ANESTHESIA, RECTUM (N/A) - Possible seton PLACEMENT, SETON (N/A) as a surgical intervention.  [name] history has been reviewed, patient examined, no change in status, stable for surgery.  I have reviewed the patient's chart and labs.  Questions were answered to the patient's satisfaction.     Linda Roach

## 2023-08-02 NOTE — Anesthesia Procedure Notes (Signed)
 Procedure Name: LMA Insertion Date/Time: 08/02/2023 8:05 AM  Performed by: Marisue Side, CRNAPre-anesthesia Checklist: Patient identified, Patient being monitored, Timeout performed, Emergency Drugs available and Suction available Patient Re-evaluated:Patient Re-evaluated prior to induction Oxygen Delivery Method: Circle system utilized Preoxygenation: Pre-oxygenation with 100% oxygen Induction Type: IV induction Ventilation: Mask ventilation without difficulty LMA: LMA inserted LMA Size: 4.0 Tube type: Oral Number of attempts: 1 Placement Confirmation: positive ETCO2 and breath sounds checked- equal and bilateral Tube secured with: Tape Dental Injury: Teeth and Oropharynx as per pre-operative assessment

## 2023-08-02 NOTE — Anesthesia Postprocedure Evaluation (Signed)
 Anesthesia Post Note  Patient: Linda Roach  Procedure(s) Performed: EXAM UNDER ANESTHESIA, RECTUM (Rectum) INCISION AND DRAINAGE, ABSCESS (Rectum)  Patient location during evaluation: PACU Anesthesia Type: General Level of consciousness: awake and alert Pain management: pain level controlled Vital Signs Assessment: post-procedure vital signs reviewed and stable Respiratory status: spontaneous breathing, nonlabored ventilation, respiratory function stable and patient connected to nasal cannula oxygen Cardiovascular status: blood pressure returned to baseline and stable Postop Assessment: no apparent nausea or vomiting Anesthetic complications: no   No notable events documented.   Last Vitals:  Vitals:   08/02/23 0900 08/02/23 0915  BP: 126/70 130/69  Pulse: 87 83  Resp: 14 16  Temp:    SpO2: 99% 100%    Last Pain:  Vitals:   08/02/23 0915  TempSrc:   PainSc: 0-No pain                 Zula Hitch

## 2023-08-03 ENCOUNTER — Encounter: Payer: Self-pay | Admitting: Surgery
